# Patient Record
Sex: Female | Born: 1950
Health system: Southern US, Community
[De-identification: ages and names within clinical notes are randomized; demographics above are authoritative.]

## PROBLEM LIST (undated history)

## (undated) DIAGNOSIS — E785 Hyperlipidemia, unspecified: Secondary | ICD-10-CM

## (undated) DIAGNOSIS — I10 Essential (primary) hypertension: Secondary | ICD-10-CM

## (undated) DIAGNOSIS — K8020 Calculus of gallbladder without cholecystitis without obstruction: Secondary | ICD-10-CM

## (undated) HISTORY — PX: KNEE ARTHROSCOPY: SUR90

## (undated) HISTORY — PX: KNEE ARTHROSCOPY: SHX127

## (undated) HISTORY — PX: HERNIA REPAIR: SHX51

## (undated) HISTORY — PX: BACK SURGERY: SHX140

## (undated) HISTORY — DX: Hyperlipidemia, unspecified: E78.5

---

## 1997-08-14 ENCOUNTER — Observation Stay (HOSPITAL_COMMUNITY): Admission: RE | Admit: 1997-08-14 | Discharge: 1997-08-14 | Payer: Self-pay | Admitting: Neurosurgery

## 1997-11-09 ENCOUNTER — Emergency Department (HOSPITAL_COMMUNITY): Admission: EM | Admit: 1997-11-09 | Discharge: 1997-11-09 | Payer: Self-pay | Admitting: Emergency Medicine

## 1997-11-09 ENCOUNTER — Encounter: Payer: Self-pay | Admitting: Emergency Medicine

## 2000-05-26 ENCOUNTER — Ambulatory Visit (HOSPITAL_COMMUNITY): Admission: RE | Admit: 2000-05-26 | Discharge: 2000-05-26 | Payer: Self-pay | Admitting: Neurosurgery

## 2000-05-26 ENCOUNTER — Encounter: Payer: Self-pay | Admitting: Neurosurgery

## 2000-06-09 ENCOUNTER — Encounter: Payer: Self-pay | Admitting: Neurosurgery

## 2000-06-09 ENCOUNTER — Encounter: Admission: RE | Admit: 2000-06-09 | Discharge: 2000-06-09 | Payer: Self-pay | Admitting: Neurosurgery

## 2000-06-30 ENCOUNTER — Encounter: Payer: Self-pay | Admitting: Neurosurgery

## 2000-06-30 ENCOUNTER — Encounter: Admission: RE | Admit: 2000-06-30 | Discharge: 2000-06-30 | Payer: Self-pay | Admitting: Neurosurgery

## 2001-11-06 ENCOUNTER — Ambulatory Visit (HOSPITAL_COMMUNITY): Admission: RE | Admit: 2001-11-06 | Discharge: 2001-11-06 | Payer: Self-pay | Admitting: Family Medicine

## 2001-11-06 ENCOUNTER — Encounter: Payer: Self-pay | Admitting: Family Medicine

## 2001-11-07 ENCOUNTER — Ambulatory Visit (HOSPITAL_COMMUNITY): Admission: RE | Admit: 2001-11-07 | Discharge: 2001-11-07 | Payer: Self-pay | Admitting: Family Medicine

## 2001-11-07 ENCOUNTER — Encounter: Payer: Self-pay | Admitting: Family Medicine

## 2001-11-28 ENCOUNTER — Encounter (HOSPITAL_COMMUNITY): Admission: RE | Admit: 2001-11-28 | Discharge: 2001-12-28 | Payer: Self-pay | Admitting: *Deleted

## 2001-11-28 ENCOUNTER — Encounter: Payer: Self-pay | Admitting: *Deleted

## 2003-08-15 ENCOUNTER — Ambulatory Visit (HOSPITAL_COMMUNITY): Admission: RE | Admit: 2003-08-15 | Discharge: 2003-08-15 | Payer: Self-pay | Admitting: *Deleted

## 2007-10-23 ENCOUNTER — Other Ambulatory Visit: Admission: RE | Admit: 2007-10-23 | Discharge: 2007-10-23 | Payer: Self-pay | Admitting: Obstetrics and Gynecology

## 2007-11-07 ENCOUNTER — Ambulatory Visit (HOSPITAL_COMMUNITY): Admission: RE | Admit: 2007-11-07 | Discharge: 2007-11-07 | Payer: Self-pay | Admitting: Obstetrics and Gynecology

## 2009-03-07 HISTORY — PX: ENDOMETRIAL BIOPSY: SHX622

## 2009-08-19 ENCOUNTER — Other Ambulatory Visit: Admission: RE | Admit: 2009-08-19 | Discharge: 2009-08-19 | Payer: Self-pay | Admitting: Obstetrics and Gynecology

## 2009-09-14 ENCOUNTER — Other Ambulatory Visit: Admission: RE | Admit: 2009-09-14 | Discharge: 2009-09-14 | Payer: Self-pay | Admitting: Obstetrics & Gynecology

## 2010-06-17 ENCOUNTER — Other Ambulatory Visit (HOSPITAL_COMMUNITY): Payer: Self-pay | Admitting: Family Medicine

## 2010-06-17 ENCOUNTER — Ambulatory Visit (HOSPITAL_COMMUNITY)
Admission: RE | Admit: 2010-06-17 | Discharge: 2010-06-17 | Disposition: A | Discharge status: Home / Self Care | Payer: BC Managed Care – PPO | Source: Ambulatory Visit | Attending: Family Medicine | Admitting: Family Medicine

## 2010-06-17 DIAGNOSIS — Z139 Encounter for screening, unspecified: Secondary | ICD-10-CM

## 2010-06-17 DIAGNOSIS — Z1231 Encounter for screening mammogram for malignant neoplasm of breast: Secondary | ICD-10-CM | POA: Insufficient documentation

## 2010-06-17 DIAGNOSIS — R059 Cough, unspecified: Secondary | ICD-10-CM

## 2010-06-17 DIAGNOSIS — R05 Cough: Secondary | ICD-10-CM

## 2010-07-23 NOTE — Procedures (Signed)
   NAME:  Madison Garrison, Madison Garrison                         ACCOUNT NO.:   MEDICAL RECORD NO.:  [id number]                   PATIENT TYPE:   LOCATION:                                       FACILITY:  [facility]   PHYSICIAN:  Cecil Cranker, M.D. [facility]         DATE OF BIRTH:  09-Sep-1950   DATE OF PROCEDURE:  DATE OF DISCHARGE:                                    STRESS TEST   This is a 60 year old patient with history of shortness of breath and  palpitations.  Baseline EKG normal sinus rhythm.  The patient exercised 7  minutes 52 seconds to Bruce protocol obtaining a heart rate of 160.  Target  heart rate was 143.  Test was stopped due to shortness of breath and  fatigue.  She had 0.5 mm upsloping ST depression inferiorly.  No arrhythmias  or chest pain.  Cardiolite images are to follow.     Shanon Payor, M.D. [facility]    ML/MEDQ  D:  11/28/2001  T:  11/29/2001  Job:  [id number]

## 2011-06-22 ENCOUNTER — Other Ambulatory Visit (HOSPITAL_COMMUNITY)
Admission: RE | Admit: 2011-06-22 | Discharge: 2011-06-22 | Disposition: A | Discharge status: Home / Self Care | Payer: BC Managed Care – PPO | Source: Ambulatory Visit | Attending: Obstetrics and Gynecology | Admitting: Obstetrics and Gynecology

## 2011-06-22 ENCOUNTER — Other Ambulatory Visit: Payer: Self-pay | Admitting: Adult Health

## 2011-06-22 DIAGNOSIS — Z01419 Encounter for gynecological examination (general) (routine) without abnormal findings: Secondary | ICD-10-CM | POA: Insufficient documentation

## 2013-06-14 ENCOUNTER — Other Ambulatory Visit: Payer: Self-pay | Admitting: Adult Health

## 2013-06-25 ENCOUNTER — Other Ambulatory Visit (HOSPITAL_COMMUNITY)
Admission: RE | Admit: 2013-06-25 | Discharge: 2013-06-25 | Disposition: A | Discharge status: Home / Self Care | Payer: BC Managed Care – PPO | Source: Ambulatory Visit | Attending: Adult Health | Admitting: Adult Health

## 2013-06-25 ENCOUNTER — Encounter: Payer: Self-pay | Admitting: Adult Health

## 2013-06-25 ENCOUNTER — Ambulatory Visit (INDEPENDENT_AMBULATORY_CARE_PROVIDER_SITE_OTHER): Payer: BC Managed Care – PPO | Admitting: Adult Health

## 2013-06-25 VITALS — BP 120/92 | HR 78 | Ht 64.0 in | Wt 223.0 lb

## 2013-06-25 DIAGNOSIS — Z01419 Encounter for gynecological examination (general) (routine) without abnormal findings: Secondary | ICD-10-CM | POA: Insufficient documentation

## 2013-06-25 DIAGNOSIS — Z1151 Encounter for screening for human papillomavirus (HPV): Secondary | ICD-10-CM | POA: Insufficient documentation

## 2013-06-25 DIAGNOSIS — Z1212 Encounter for screening for malignant neoplasm of rectum: Secondary | ICD-10-CM

## 2013-06-25 LAB — COMPREHENSIVE METABOLIC PANEL
ALK PHOS: 95 U/L (ref 39–117)
ALT: 24 U/L (ref 0–35)
AST: 17 U/L (ref 0–37)
Albumin: 4.2 g/dL (ref 3.5–5.2)
BILIRUBIN TOTAL: 0.6 mg/dL (ref 0.2–1.2)
BUN: 12 mg/dL (ref 6–23)
CO2: 28 mEq/L (ref 19–32)
CREATININE: 0.69 mg/dL (ref 0.50–1.10)
Calcium: 9.2 mg/dL (ref 8.4–10.5)
Chloride: 99 mEq/L (ref 96–112)
GLUCOSE: 102 mg/dL — AB (ref 70–99)
POTASSIUM: 4 meq/L (ref 3.5–5.3)
Sodium: 140 mEq/L (ref 135–145)
Total Protein: 7.1 g/dL (ref 6.0–8.3)

## 2013-06-25 LAB — CBC
HEMATOCRIT: 39.6 % (ref 36.0–46.0)
HEMOGLOBIN: 13.1 g/dL (ref 12.0–15.0)
MCH: 27.9 pg (ref 26.0–34.0)
MCHC: 33.1 g/dL (ref 30.0–36.0)
MCV: 84.3 fL (ref 78.0–100.0)
Platelets: 255 10*3/uL (ref 150–400)
RBC: 4.7 MIL/uL (ref 3.87–5.11)
RDW: 14.6 % (ref 11.5–15.5)
WBC: 6.7 10*3/uL (ref 4.0–10.5)

## 2013-06-25 LAB — LIPID PANEL
CHOLESTEROL: 236 mg/dL — AB (ref 0–200)
HDL: 46 mg/dL (ref >39)
LDL Cholesterol: 155 mg/dL — ABNORMAL HIGH (ref 0–99)
TRIGLYCERIDES: 174 mg/dL — AB (ref <150)
Total CHOL/HDL Ratio: 5.1 Ratio
VLDL: 35 mg/dL (ref 0–40)

## 2013-06-25 LAB — HEMOCCULT GUIAC POC 1CARD (OFFICE): FECAL OCCULT BLD: NEGATIVE

## 2013-06-25 NOTE — Progress Notes (Signed)
Patient ID: Madison Garrison, female   DOB: 16-Sep-1950, 63 y.o.   MRN: [id number] History of Present Illness: Madison Garrison is a 63 year old white female in for a pap and physical.She sees Dr Everette Rank for her PCP.   Current Medications, Allergies, Past Medical History, Past Surgical History, Family History and Social History were reviewed in [employer] record.     Review of Systems: patient denies any headaches, blurred vision, shortness of breath, chest pain, abdominal pain, problems with bowel movements, urination, or intercourse. No joint pain or mood swings, has spot on nose that I told her to see dermatologist about 2-3 years ago and she still needs to see.Has had cough but says 90% better than it was.    Physical Exam:BP 120/92  Pulse 78  Ht 5\' 4"  (1.626 m)  Wt 223 lb (101.152 kg)  BMI 38.26 kg/m2 General:  Well developed, well nourished, no acute distress Skin:  Warm and dry, has brown area left nasal area,?teleangioma  Neck:  Midline trachea, normal thyroid Lungs; Clear to auscultation bilaterally Breast:  No dominant palpable mass, retraction, or nipple discharge Cardiovascular: Regular rate and rhythm Abdomen:  Soft, non tender, no hepatosplenomegaly Pelvic:  External genitalia is normal in appearance.  The vagina is normal in appearance for age.               The cervix is atrophic, pap with HPV performed.Marland Kitchen  Uterus is felt to be normal size, shape, and contour.  No  adnexal masses or tenderness noted. Rectal: Good sphincter tone, no polyps, or small hemorrhoids felt.  Hemoccult negative. Extremities:  No swelling  Noted today but she says feet swell at times Psych:  No mood changes, alert and cooperative, seems happy, says she is going to gym and will try to lose weight   Impression: Yearly gyn exam    Plan: Physical in 1 year, pap in 3 if negative HPV Mammogram advised now and yearly Colonoscopy advised Check CBC,CMP,TSH and lipids  See  dermatologist

## 2013-06-25 NOTE — Patient Instructions (Signed)
Physical in 1 year Mammogram yearly colonoscopy advised See dermatologist about nose

## 2013-06-26 LAB — TSH: TSH: 1.678 u[IU]/mL (ref 0.350–4.500)

## 2013-07-02 ENCOUNTER — Telehealth: Payer: Self-pay | Admitting: Adult Health

## 2013-07-02 NOTE — Telephone Encounter (Signed)
Please review labs and advise.

## 2013-07-02 NOTE — Telephone Encounter (Signed)
Left message to call in am  

## 2013-07-03 ENCOUNTER — Telehealth: Payer: Self-pay | Admitting: Adult Health

## 2013-07-03 NOTE — Telephone Encounter (Signed)
Pt aware of pap and lab results and need to incease activity an cut carbs and recheck labs in 6 months, put in recall, if still has elevated lipids will rx statin then

## 2013-12-03 ENCOUNTER — Other Ambulatory Visit: Payer: Self-pay | Admitting: Orthopaedic Surgery

## 2013-12-03 DIAGNOSIS — M25562 Pain in left knee: Secondary | ICD-10-CM

## 2013-12-12 ENCOUNTER — Ambulatory Visit
Admission: RE | Admit: 2013-12-12 | Discharge: 2013-12-12 | Disposition: A | Discharge status: Home / Self Care | Payer: BC Managed Care – PPO | Source: Ambulatory Visit | Attending: Orthopaedic Surgery | Admitting: Orthopaedic Surgery

## 2013-12-12 DIAGNOSIS — M25562 Pain in left knee: Secondary | ICD-10-CM

## 2014-01-06 ENCOUNTER — Encounter: Payer: Self-pay | Admitting: Adult Health

## 2015-05-08 DIAGNOSIS — R7301 Impaired fasting glucose: Secondary | ICD-10-CM | POA: Diagnosis not present

## 2015-05-08 DIAGNOSIS — E782 Mixed hyperlipidemia: Secondary | ICD-10-CM | POA: Diagnosis not present

## 2015-05-11 DIAGNOSIS — R945 Abnormal results of liver function studies: Secondary | ICD-10-CM | POA: Diagnosis not present

## 2015-05-11 DIAGNOSIS — M545 Low back pain: Secondary | ICD-10-CM | POA: Diagnosis not present

## 2015-05-11 DIAGNOSIS — M25569 Pain in unspecified knee: Secondary | ICD-10-CM | POA: Diagnosis not present

## 2015-05-11 DIAGNOSIS — R7301 Impaired fasting glucose: Secondary | ICD-10-CM | POA: Diagnosis not present

## 2015-05-27 DIAGNOSIS — M25562 Pain in left knee: Secondary | ICD-10-CM | POA: Diagnosis not present

## 2015-06-09 DIAGNOSIS — J301 Allergic rhinitis due to pollen: Secondary | ICD-10-CM | POA: Diagnosis not present

## 2015-07-01 DIAGNOSIS — M25562 Pain in left knee: Secondary | ICD-10-CM | POA: Diagnosis not present

## 2016-04-01 DIAGNOSIS — E782 Mixed hyperlipidemia: Secondary | ICD-10-CM | POA: Diagnosis not present

## 2016-04-01 DIAGNOSIS — R7301 Impaired fasting glucose: Secondary | ICD-10-CM | POA: Diagnosis not present

## 2016-04-05 DIAGNOSIS — E782 Mixed hyperlipidemia: Secondary | ICD-10-CM | POA: Diagnosis not present

## 2016-04-05 DIAGNOSIS — R7301 Impaired fasting glucose: Secondary | ICD-10-CM | POA: Diagnosis not present

## 2016-04-05 DIAGNOSIS — Z0000 Encounter for general adult medical examination without abnormal findings: Secondary | ICD-10-CM | POA: Diagnosis not present

## 2016-04-05 DIAGNOSIS — R945 Abnormal results of liver function studies: Secondary | ICD-10-CM | POA: Diagnosis not present

## 2016-08-20 DIAGNOSIS — J0100 Acute maxillary sinusitis, unspecified: Secondary | ICD-10-CM | POA: Diagnosis not present

## 2017-01-02 ENCOUNTER — Encounter (HOSPITAL_COMMUNITY): Payer: Self-pay | Admitting: *Deleted

## 2017-01-02 DIAGNOSIS — Z6836 Body mass index (BMI) 36.0-36.9, adult: Secondary | ICD-10-CM | POA: Diagnosis not present

## 2017-01-02 DIAGNOSIS — Z79899 Other long term (current) drug therapy: Secondary | ICD-10-CM | POA: Insufficient documentation

## 2017-01-02 DIAGNOSIS — R109 Unspecified abdominal pain: Secondary | ICD-10-CM | POA: Diagnosis present

## 2017-01-02 DIAGNOSIS — Z7722 Contact with and (suspected) exposure to environmental tobacco smoke (acute) (chronic): Secondary | ICD-10-CM | POA: Insufficient documentation

## 2017-01-02 DIAGNOSIS — K8012 Calculus of gallbladder with acute and chronic cholecystitis without obstruction: Secondary | ICD-10-CM | POA: Diagnosis not present

## 2017-01-02 DIAGNOSIS — I10 Essential (primary) hypertension: Secondary | ICD-10-CM | POA: Insufficient documentation

## 2017-01-02 DIAGNOSIS — Z7982 Long term (current) use of aspirin: Secondary | ICD-10-CM | POA: Insufficient documentation

## 2017-01-02 DIAGNOSIS — K819 Cholecystitis, unspecified: Secondary | ICD-10-CM | POA: Diagnosis not present

## 2017-01-02 DIAGNOSIS — K5730 Diverticulosis of large intestine without perforation or abscess without bleeding: Secondary | ICD-10-CM | POA: Diagnosis not present

## 2017-01-02 NOTE — ED Triage Notes (Signed)
Pt complains of right upper quadrant pain that started around 0900.

## 2017-01-03 ENCOUNTER — Observation Stay (HOSPITAL_COMMUNITY): Payer: Medicare HMO

## 2017-01-03 ENCOUNTER — Observation Stay (HOSPITAL_COMMUNITY)
Admission: EM | Admit: 2017-01-03 | Discharge: 2017-01-04 | Disposition: A | Discharge status: Home / Self Care | Payer: Medicare HMO | Attending: General Surgery | Admitting: General Surgery

## 2017-01-03 ENCOUNTER — Encounter (HOSPITAL_COMMUNITY): Payer: Self-pay | Admitting: Internal Medicine

## 2017-01-03 ENCOUNTER — Emergency Department (HOSPITAL_COMMUNITY): Payer: Medicare HMO

## 2017-01-03 DIAGNOSIS — K5730 Diverticulosis of large intestine without perforation or abscess without bleeding: Secondary | ICD-10-CM | POA: Diagnosis not present

## 2017-01-03 DIAGNOSIS — R9389 Abnormal findings on diagnostic imaging of other specified body structures: Secondary | ICD-10-CM

## 2017-01-03 DIAGNOSIS — I10 Essential (primary) hypertension: Secondary | ICD-10-CM | POA: Diagnosis not present

## 2017-01-03 DIAGNOSIS — K8000 Calculus of gallbladder with acute cholecystitis without obstruction: Secondary | ICD-10-CM | POA: Diagnosis not present

## 2017-01-03 DIAGNOSIS — E785 Hyperlipidemia, unspecified: Secondary | ICD-10-CM | POA: Diagnosis present

## 2017-01-03 DIAGNOSIS — K819 Cholecystitis, unspecified: Secondary | ICD-10-CM

## 2017-01-03 DIAGNOSIS — K8020 Calculus of gallbladder without cholecystitis without obstruction: Secondary | ICD-10-CM

## 2017-01-03 DIAGNOSIS — R935 Abnormal findings on diagnostic imaging of other abdominal regions, including retroperitoneum: Secondary | ICD-10-CM | POA: Diagnosis present

## 2017-01-03 DIAGNOSIS — Z79899 Other long term (current) drug therapy: Secondary | ICD-10-CM | POA: Diagnosis not present

## 2017-01-03 DIAGNOSIS — K8050 Calculus of bile duct without cholangitis or cholecystitis without obstruction: Secondary | ICD-10-CM | POA: Diagnosis present

## 2017-01-03 DIAGNOSIS — Z7722 Contact with and (suspected) exposure to environmental tobacco smoke (acute) (chronic): Secondary | ICD-10-CM | POA: Diagnosis not present

## 2017-01-03 DIAGNOSIS — N8500 Endometrial hyperplasia, unspecified: Secondary | ICD-10-CM | POA: Diagnosis not present

## 2017-01-03 DIAGNOSIS — Z6836 Body mass index (BMI) 36.0-36.9, adult: Secondary | ICD-10-CM

## 2017-01-03 HISTORY — DX: Calculus of gallbladder without cholecystitis without obstruction: K80.20

## 2017-01-03 HISTORY — DX: Essential (primary) hypertension: I10

## 2017-01-03 LAB — COMPREHENSIVE METABOLIC PANEL
ALBUMIN: 4.2 g/dL (ref 3.5–5.0)
ALT: 21 U/L (ref 14–54)
AST: 16 U/L (ref 15–41)
Alkaline Phosphatase: 101 U/L (ref 38–126)
Anion gap: 8 (ref 5–15)
BILIRUBIN TOTAL: 0.5 mg/dL (ref 0.3–1.2)
BUN: 14 mg/dL (ref 6–20)
CHLORIDE: 100 mmol/L — AB (ref 101–111)
CO2: 29 mmol/L (ref 22–32)
Calcium: 9.3 mg/dL (ref 8.9–10.3)
Creatinine, Ser: 0.72 mg/dL (ref 0.44–1.00)
GFR calc Af Amer: >60 mL/min (ref >60)
GFR calc non Af Amer: >60 mL/min (ref >60)
GLUCOSE: 155 mg/dL — AB (ref 65–99)
POTASSIUM: 3.8 mmol/L (ref 3.5–5.1)
Sodium: 137 mmol/L (ref 135–145)
TOTAL PROTEIN: 7.7 g/dL (ref 6.5–8.1)

## 2017-01-03 LAB — SURGICAL PCR SCREEN
MRSA, PCR: NEGATIVE
Staphylococcus aureus: NEGATIVE

## 2017-01-03 LAB — URINALYSIS, ROUTINE W REFLEX MICROSCOPIC
BILIRUBIN URINE: NEGATIVE
Glucose, UA: NEGATIVE mg/dL
HGB URINE DIPSTICK: NEGATIVE
KETONES UR: NEGATIVE mg/dL
Leukocytes, UA: NEGATIVE
NITRITE: NEGATIVE
Protein, ur: NEGATIVE mg/dL
Specific Gravity, Urine: 1.012 (ref 1.005–1.030)
pH: 7 (ref 5.0–8.0)

## 2017-01-03 LAB — CBC
HCT: 43.7 % (ref 36.0–46.0)
Hemoglobin: 14.3 g/dL (ref 12.0–15.0)
MCH: 28.9 pg (ref 26.0–34.0)
MCHC: 32.7 g/dL (ref 30.0–36.0)
MCV: 88.3 fL (ref 78.0–100.0)
Platelets: 198 10*3/uL (ref 150–400)
RBC: 4.95 MIL/uL (ref 3.87–5.11)
RDW: 13.9 % (ref 11.5–15.5)
WBC: 7.9 10*3/uL (ref 4.0–10.5)

## 2017-01-03 LAB — LIPASE, BLOOD: LIPASE: 50 U/L (ref 11–51)

## 2017-01-03 MED ORDER — SODIUM CHLORIDE 0.45 % IV SOLN
INTRAVENOUS | Status: DC
  Administered 2017-01-03 – 2017-01-04 (×2): via INTRAVENOUS

## 2017-01-03 MED ORDER — FENTANYL CITRATE (PF) 100 MCG/2ML IJ SOLN
50.0000 ug | INTRAMUSCULAR | Status: DC | PRN
  Filled 2017-01-03: qty 2

## 2017-01-03 MED ORDER — FENTANYL CITRATE (PF) 100 MCG/2ML IJ SOLN
50.0000 ug | Freq: Once | INTRAMUSCULAR | Status: AC
  Administered 2017-01-03: 50 ug via INTRAMUSCULAR
  Filled 2017-01-03: qty 2

## 2017-01-03 MED ORDER — ONDANSETRON HCL 4 MG PO TABS: 4.0000 mg | ORAL_TABLET | Freq: Four times a day (QID) | ORAL | Status: DC | PRN

## 2017-01-03 MED ORDER — ONDANSETRON 4 MG PO TBDP
4.0000 mg | ORAL_TABLET | Freq: Once | ORAL | Status: AC
Start: 2017-01-03 — End: 2017-01-03
  Administered 2017-01-03: 4 mg via ORAL
  Filled 2017-01-03: qty 1

## 2017-01-03 MED ORDER — CHLORHEXIDINE GLUCONATE CLOTH 2 % EX PADS
6.0000 | MEDICATED_PAD | Freq: Once | CUTANEOUS | Status: AC
  Administered 2017-01-03: 6 via TOPICAL

## 2017-01-03 MED ORDER — METOPROLOL SUCCINATE ER 25 MG PO TB24
25.0000 mg | ORAL_TABLET | Freq: Every day | ORAL | Status: DC
  Administered 2017-01-03: 25 mg via ORAL
  Filled 2017-01-03: qty 1

## 2017-01-03 MED ORDER — ONDANSETRON HCL 4 MG/2ML IJ SOLN
4.0000 mg | Freq: Four times a day (QID) | INTRAMUSCULAR | Status: DC | PRN
  Administered 2017-01-04: 4 mg via INTRAVENOUS
  Filled 2017-01-03: qty 2

## 2017-01-03 MED ORDER — CEFTRIAXONE SODIUM 2 G IJ SOLR
2.0000 g | INTRAMUSCULAR | Status: DC
  Administered 2017-01-04: 2 g via INTRAVENOUS
  Filled 2017-01-03 (×2): qty 2

## 2017-01-03 MED ORDER — PANTOPRAZOLE SODIUM 40 MG IV SOLR
40.0000 mg | INTRAVENOUS | Status: DC
  Administered 2017-01-03: 40 mg via INTRAVENOUS
  Filled 2017-01-03: qty 40

## 2017-01-03 MED ORDER — IOPAMIDOL (ISOVUE-300) INJECTION 61%
100.0000 mL | Freq: Once | INTRAVENOUS | Status: AC | PRN
  Administered 2017-01-03: 100 mL via INTRAVENOUS

## 2017-01-03 MED ORDER — DEXTROSE 5 % IV SOLN
2.0000 g | Freq: Once | INTRAVENOUS | Status: AC
  Administered 2017-01-03: 2 g via INTRAVENOUS
  Filled 2017-01-03: qty 2

## 2017-01-03 MED ORDER — SODIUM CHLORIDE 0.9 % IV BOLUS (SEPSIS)
500.0000 mL | Freq: Once | INTRAVENOUS | Status: AC
  Administered 2017-01-03: 500 mL via INTRAVENOUS

## 2017-01-03 NOTE — H&P (Signed)
History and Physical    Madison Garrison ZOX:[id number] DOB: 1951-01-02 DOA: 01/03/2017  PCP: Celene Squibb, MD   Patient coming from: Home.  I have personally briefly reviewed patient's old medical records in [place]  Chief Complaint: Abdominal pain.  HPI: Madison Garrison is a 66 y.o. female with medical history significant of hyperlipidemia, not treated hypertension who is coming to the emergency department with complaints of intense, progressively worse RUQ pain radiates into her back associated with frequent burping after she had oat meal and tea for dinner. She also mentions her stomach feels bloated and she has been having occasional flatulence. She denies fever, chills, nausea, emesis, diarrhea, constipation, melena or hematochezia. Denies dysuria, frequency, hematuria or oliguria. No chest pain, dyspnea, dizziness, diaphoresis, palpitations, PND or orthopnea. She mentions that she occasionally gets lower extremity edema is she stays on her feet for an extended period of time. No polyuria, polydipsia or blurred vision.  ED Course: Initial vital signs temperature 97.64F, pulse 80, respirations 20, blood pressure 175/94 mmHg and O2 sat 98% on room air. She received fentanyl 50 g IM 1, Zofran 4 mg tablet, Rocephin 2 g IV PB and a normal saline bolus of 500 mL.  Her workup shows an unremarkable urinalysis. WBC is 7.9, hemoglobin 14.3 and platelets 198. Her lipase level was 50. Her CMP showed a chloride level of 100 mmol/L and a glucose of 155 mg/dL, all other values were normal.  Imaging 1. Possible noncalcified gallstone in the gallbladder neck with mild gallbladder wall thickening or small amount pericholecystic fluid. Given right upper quadrant pain, recommend right upper quadrant ultrasound for further evaluation. 2. Cystic structure adjacent to or within the uncinate process of the pancreas measuring 2.2 cm greatest dimension. Recommend follow-up imaging in 6 months with  contrast-enhanced MRI air pancreas protocol CT. These may reflect sequela of prior pancreatitis or cystic lesion. 3. Indeterminate 2.4 cm left adrenal nodule. No prior exams are available for comparison. In the absence of malignancy history this is probably benign, consider 12 month follow-up adrenal CT. 4. Endometrial fluid or thickening of 14 mm, abnormal for a postmenopausal patient. Recommend initial characterization with nonemergent pelvic ultrasound. 5. Incidental hepatic steatosis, minimal colonic diverticulosis, and aortic atherosclerosis. Aortic Atherosclerosis (ICD10-I70.0).  Please see images and full radiology report for further detail.  Review of Systems: As per HPI otherwise 10 point review of systems negative.    Past Medical History:  Diagnosis Date  . Cholelithiasis   . Hyperlipidemia   . Hypertension     Past Surgical History:  Procedure Laterality Date  . BACK SURGERY    . ENDOMETRIAL BIOPSY  2011  . HERNIA REPAIR    . KNEE ARTHROSCOPY Right   . KNEE ARTHROSCOPY Left      reports that she is a non-smoker but has been exposed to tobacco smoke. She has never used smokeless tobacco. She reports that she does not drink alcohol or use drugs.  No Known Allergies  Family History  Problem Relation Age of Onset  . Hypertension Mother   . Hypertension Father   . Kidney disease Father   . Heart disease Father   . Cancer Daughter 88       melanoma  . Hypertension Maternal Grandmother   . Hypertension Maternal Grandfather   . Hypertension Paternal Grandmother   . Hypertension Paternal Grandfather   . Cancer Sister 40       ovarian   . Diabetes Sister   .  Hypertension Sister   . Mental illness Brother     Prior to Admission medications   Medication Sig Start Date End Date Taking? Authorizing Provider  Calcium Carbonate-Vitamin D (CALCIUM + D PO) Take 1 tablet by mouth daily.    [provider]  cholecalciferol (VITAMIN D) 1000 UNITS tablet Take 1,000  Units by mouth daily.    [provider]  Multiple Vitamins-Minerals (MULTIVITAMIN WITH MINERALS) tablet Take 1 tablet by mouth daily.    [provider]    Physical Exam: Vitals:   01/02/17 2327  BP: (!) 175/94  Pulse: 80  Resp: 20  Temp: 97.8 F (36.6 C)  TempSrc: Oral  SpO2: 98%  Weight: 97.5 kg (215 lb)  Height: 5\' 4"  (1.626 m)    Constitutional: NAD, calm, comfortable Eyes: PERRL, lids and conjunctivae normal ENMT: Mucous membranes are moist. Posterior pharynx clear of any exudate or lesions. Neck: normal, supple, no masses, no thyromegaly Respiratory: clear to auscultation bilaterally, no wheezing, no crackles. Normal respiratory effort. No accessory muscle use.  Cardiovascular: Regular rate and rhythm, no murmurs / rubs / gallops. No extremity edema. 2+ pedal pulses. No carotid bruits.  Abdomen: Soft, positive RUQ tenderness, no guarding/rebound/masses palpated. No hepatosplenomegaly. Bowel sounds positive.  Musculoskeletal: no clubbing / cyanosis. Good ROM, no contractures. Normal muscle tone.  Skin: no rashes, lesions, ulcers. No induration Neurologic: CN 2-12 grossly intact. Sensation intact, DTR normal. Strength 5/5 in all 4.  Psychiatric: Normal judgment and insight. Alert and oriented x 3. Normal mood.    Labs on Admission: I have personally reviewed following labs and imaging studies  CBC:  Recent Labs Lab 01/02/17 2350  WBC 7.9  HGB 14.3  HCT 43.7  MCV 88.3  PLT 161   Basic Metabolic Panel:  Recent Labs Lab 01/02/17 2350  NA 137  K 3.8  CL 100*  CO2 29  GLUCOSE 155*  BUN 14  CREATININE 0.72  CALCIUM 9.3   GFR: Estimated Creatinine Clearance: 78.4 mL/min (by C-G formula based on SCr of 0.72 mg/dL). Liver Function Tests:  Recent Labs Lab 01/02/17 2350  AST 16  ALT 21  ALKPHOS 101  BILITOT 0.5  PROT 7.7  ALBUMIN 4.2    Recent Labs Lab 01/02/17 2350  LIPASE 50   No results for input(s): AMMONIA in the last 168  hours. Coagulation Profile: No results for input(s): INR, PROTIME in the last 168 hours. Cardiac Enzymes: No results for input(s): CKTOTAL, CKMB, CKMBINDEX, TROPONINI in the last 168 hours. BNP (last 3 results) No results for input(s): PROBNP in the last 8760 hours. HbA1C: No results for input(s): HGBA1C in the last 72 hours. CBG: No results for input(s): GLUCAP in the last 168 hours. Lipid Profile: No results for input(s): CHOL, HDL, LDLCALC, TRIG, CHOLHDL, LDLDIRECT in the last 72 hours. Thyroid Function Tests: No results for input(s): TSH, T4TOTAL, FREET4, T3FREE, THYROIDAB in the last 72 hours. Anemia Panel: No results for input(s): VITAMINB12, FOLATE, FERRITIN, TIBC, IRON, RETICCTPCT in the last 72 hours. Urine analysis:    Component Value Date/Time   COLORURINE STRAW (A) 01/02/2017 2329   APPEARANCEUR CLEAR 01/02/2017 2329   LABSPEC 1.012 01/02/2017 2329   PHURINE 7.0 01/02/2017 2329   GLUCOSEU NEGATIVE 01/02/2017 2329   HGBUR NEGATIVE 01/02/2017 2329   BILIRUBINUR NEGATIVE 01/02/2017 2329   KETONESUR NEGATIVE 01/02/2017 2329   PROTEINUR NEGATIVE 01/02/2017 2329   NITRITE NEGATIVE 01/02/2017 2329   LEUKOCYTESUR NEGATIVE 01/02/2017 2329    Radiological Exams on Admission:  Ct Abdomen Pelvis W Contrast  Result Date: 01/03/2017 CLINICAL DATA:  Right upper quadrant pain. EXAM: CT ABDOMEN AND PELVIS WITH CONTRAST TECHNIQUE: Multidetector CT imaging of the abdomen and pelvis was performed using the standard protocol following bolus administration of intravenous contrast. CONTRAST:  113mL ISOVUE-300 IOPAMIDOL (ISOVUE-300) INJECTION 61% COMPARISON:  None. FINDINGS: Lower chest: Linear atelectasis in the right lower lobe. No pleural fluid or consolidation. Hepatobiliary: Decreased hepatic density consistent with steatosis. Questionable noncalcified gallstone in the gallbladder neck with mild gallbladder wall thickening or pericholecystic fluid. No biliary dilatation. Pancreas: 19 x  13 x 22 mm cystic structure within or adjacent of the uncinate process of the pancreas. No ductal dilatation or inflammation. Spleen: Normal in size without focal abnormality. Adrenals/Urinary Tract: 24 mm left adrenal nodule. The right adrenal gland is normal. No hydronephrosis or perinephric edema. Homogeneous renal enhancement with symmetric excretion on delayed phase imaging. 3.5 cm cyst in the lower anterior right kidney. Urinary bladder is physiologically distended without wall thickening. Stomach/Bowel: Stomach is within normal limits. Appendix appears normal. No evidence of bowel wall thickening, distention, or inflammatory changes. Distal colonic tortuosity with minimal diverticulosis. Vascular/Lymphatic: Mild aortic atherosclerosis without aneurysm. No abdominal or pelvic adenopathy. Reproductive: Endometrial thickening versus fluid measuring 14 mm. Ovaries are quiescent. No adnexal mass. Other: Small fat containing umbilical hernia. No free air or ascites. Musculoskeletal: Near complete disc space loss at L5-S1 with grade 1 anterolisthesis. Set arthropathy in the lower lumbar spine. There are no acute or suspicious osseous abnormalities. IMPRESSION: 1. Possible noncalcified gallstone in the gallbladder neck with mild gallbladder wall thickening or small amount pericholecystic fluid. Given right upper quadrant pain, recommend right upper quadrant ultrasound for further evaluation. 2. Cystic structure adjacent to or within the uncinate process of the pancreas measuring 2.2 cm greatest dimension. Recommend follow-up imaging in 6 months with contrast-enhanced MRI air pancreas protocol CT. These may reflect sequela of prior pancreatitis or cystic lesion. 3. Indeterminate 2.4 cm left adrenal nodule. No prior exams are available for comparison. In the absence of malignancy history this is probably benign, consider 12 month follow-up adrenal CT. 4. Endometrial fluid or thickening of 14 mm, abnormal for a  postmenopausal patient. Recommend initial characterization with nonemergent pelvic ultrasound. 5. Incidental hepatic steatosis, minimal colonic diverticulosis, and aortic atherosclerosis. Aortic Atherosclerosis (ICD10-I70.0). Electronically Signed   By: Jeb Levering M.D.   On: 01/03/2017 03:57    EKG: Independently reviewed.   Assessment/Plan Principal Problem:   Biliary colic   Cholelithiasis Observation unit/MedSurg Keep nothing by mouth. Gentle IV hydration. Analgesics as needed. Antiemetics as needed. Protonix 40 mg IVP every 24 hours. Rocephin 2 g IVPB every 24 hours. Right upper quadrant ultrasound. General surgery evaluation later today.  Active Problems:   Hyperlipidemia Continue lifestyle modifications.    Hypertension The patient has had previous measurements with her systolic BP ranging in the 150s at her doctor's office. Discussed briefly with the patient. Her father had a history of uncontrolled/nontreated hypertension and developed hypertensive renal disease. I will check an EKG and start the patient on low-dose beta blocker. Monitor blood pressure.    Abnormal CT of the abdomen Results were discussed with the patient.  She agrees with pelvic ultrasound today for endometriosis. She is also aware of MRI follow-up for pancreatic cyst in 6 months. Also made aware of CT follow-up for left adrenal nodule.    DVT prophylaxis: SCDs. Code Status: Full code. Family Communication:  Disposition Plan: Observation for further evaluation and general surgery  consultation. Consults called: Routine general surgery consult. Admission status: Observation/MedSurg.   Reubin Milan MD [facility] Pager [phone]  If 7PM-7AM, please contact night-coverage [email or web address] Password [email or web address]  01/03/2017, 5:37 AM

## 2017-01-03 NOTE — Consult Note (Signed)
Reason for Consult: Right upper quadrant abdominal pain, cholelithiasis Referring Physician: Dr. Arrien  Madison Garrison is an 66 y.o. female.  HPI: Patient is a 66-year-old white female who was in her usual state of health when she developed an episode of right upper quadrant abdominal pain and nausea.  This occurred yesterday evening.  She states this is her first episode and she has never had this type of pain.  She also was complaining of bloating.  She denies a history of fatty food intolerance, fever, chills, or jaundice.  She presented to the emergency room and a CT scan of the abdomen revealed probable cholelithiasis with cholecystitis.  She was brought into the hospital and states her abdominal pain and nausea have fully resolved.  She is hungry.  She has 0 out of 10 abdominal pain today.  Past Medical History:  Diagnosis Date  . Cholelithiasis   . Hyperlipidemia   . Hypertension     Past Surgical History:  Procedure Laterality Date  . BACK SURGERY    . ENDOMETRIAL BIOPSY  2011  . HERNIA REPAIR    . KNEE ARTHROSCOPY Right   . KNEE ARTHROSCOPY Left     Family History  Problem Relation Age of Onset  . Hypertension Mother   . Hypertension Father   . Kidney disease Father   . Heart disease Father   . Cancer Daughter 32       melanoma  . Hypertension Maternal Grandmother   . Hypertension Maternal Grandfather   . Hypertension Paternal Grandmother   . Hypertension Paternal Grandfather   . Cancer Sister 40       ovarian   . Diabetes Sister   . Hypertension Sister   . Mental illness Brother     Social History:  reports that she is a non-smoker but has been exposed to tobacco smoke. She has never used smokeless tobacco. She reports that she does not drink alcohol or use drugs.  Allergies: No Known Allergies  Medications:  Scheduled: . metoprolol succinate  25 mg Oral Daily  . pantoprazole (PROTONIX) IV  40 mg Intravenous Q24H    Results for orders placed or performed  during the hospital encounter of 01/03/17 (from the past 48 hour(s))  Urinalysis, Routine w reflex microscopic     Status: Abnormal   Collection Time: 01/02/17 11:29 PM  Result Value Ref Range   Color, Urine STRAW (A) YELLOW   APPearance CLEAR CLEAR   Specific Gravity, Urine 1.012 1.005 - 1.030   pH 7.0 5.0 - 8.0   Glucose, UA NEGATIVE NEGATIVE mg/dL   Hgb urine dipstick NEGATIVE NEGATIVE   Bilirubin Urine NEGATIVE NEGATIVE   Ketones, ur NEGATIVE NEGATIVE mg/dL   Protein, ur NEGATIVE NEGATIVE mg/dL   Nitrite NEGATIVE NEGATIVE   Leukocytes, UA NEGATIVE NEGATIVE  Lipase, blood     Status: None   Collection Time: 01/02/17 11:50 PM  Result Value Ref Range   Lipase 50 11 - 51 U/L  Comprehensive metabolic panel     Status: Abnormal   Collection Time: 01/02/17 11:50 PM  Result Value Ref Range   Sodium 137 135 - 145 mmol/L   Potassium 3.8 3.5 - 5.1 mmol/L   Chloride 100 (L) 101 - 111 mmol/L   CO2 29 22 - 32 mmol/L   Glucose, Bld 155 (H) 65 - 99 mg/dL   BUN 14 6 - 20 mg/dL   Creatinine, Ser 0.72 0.44 - 1.00 mg/dL   Calcium 9.3 8.9 - 10.3 mg/dL     Total Protein 7.7 6.5 - 8.1 g/dL   Albumin 4.2 3.5 - 5.0 g/dL   AST 16 15 - 41 U/L   ALT 21 14 - 54 U/L   Alkaline Phosphatase 101 38 - 126 U/L   Total Bilirubin 0.5 0.3 - 1.2 mg/dL   GFR calc non Af Amer >60 >60 mL/min   GFR calc Af Amer >60 >60 mL/min    Comment: (NOTE) The eGFR has been calculated using the CKD EPI equation. This calculation has not been validated in all clinical situations. eGFR's persistently <60 mL/min signify possible Chronic Kidney Disease.    Anion gap 8 5 - 15  CBC     Status: None   Collection Time: 01/02/17 11:50 PM  Result Value Ref Range   WBC 7.9 4.0 - 10.5 K/uL   RBC 4.95 3.87 - 5.11 MIL/uL   Hemoglobin 14.3 12.0 - 15.0 g/dL   HCT 43.7 36.0 - 46.0 %   MCV 88.3 78.0 - 100.0 fL   MCH 28.9 26.0 - 34.0 pg   MCHC 32.7 30.0 - 36.0 g/dL   RDW 13.9 11.5 - 15.5 %   Platelets 198 150 - 400 K/uL    Us  Pelvis (transabdominal Only)  Result Date: 01/03/2017 CLINICAL DATA:  Thickened endometrium. EXAM: TRANSABDOMINAL ULTRASOUND OF PELVIS TECHNIQUE: Transabdominal ultrasound examination of the pelvis was performed including evaluation of the uterus, ovaries, adnexal regions, and pelvic cul-de-sac. COMPARISON:  CT scan of same day. FINDINGS: Uterus Measurements: 9.0 x 5.8 x 4.8 cm. No fibroids or other mass visualized. Endometrium Thickness: 13 mm which is abnormally thickened for postmenopausal patient. Focal calcification is noted. Right ovary Not visualized. Left ovary Not visualized Other findings:  No abnormal free fluid. IMPRESSION: Ovaries are not visualized. Endometrial thickness is considered abnormal for an asymptomatic post-menopausal female. Endometrial sampling should be considered to exclude carcinoma. Electronically Signed   By: James  Green Jr, M.D.   On: 01/03/2017 09:04   Ct Abdomen Pelvis W Contrast  Result Date: 01/03/2017 CLINICAL DATA:  Right upper quadrant pain. EXAM: CT ABDOMEN AND PELVIS WITH CONTRAST TECHNIQUE: Multidetector CT imaging of the abdomen and pelvis was performed using the standard protocol following bolus administration of intravenous contrast. CONTRAST:  100mL ISOVUE-300 IOPAMIDOL (ISOVUE-300) INJECTION 61% COMPARISON:  None. FINDINGS: Lower chest: Linear atelectasis in the right lower lobe. No pleural fluid or consolidation. Hepatobiliary: Decreased hepatic density consistent with steatosis. Questionable noncalcified gallstone in the gallbladder neck with mild gallbladder wall thickening or pericholecystic fluid. No biliary dilatation. Pancreas: 19 x 13 x 22 mm cystic structure within or adjacent of the uncinate process of the pancreas. No ductal dilatation or inflammation. Spleen: Normal in size without focal abnormality. Adrenals/Urinary Tract: 24 mm left adrenal nodule. The right adrenal gland is normal. No hydronephrosis or perinephric edema. Homogeneous renal  enhancement with symmetric excretion on delayed phase imaging. 3.5 cm cyst in the lower anterior right kidney. Urinary bladder is physiologically distended without wall thickening. Stomach/Bowel: Stomach is within normal limits. Appendix appears normal. No evidence of bowel wall thickening, distention, or inflammatory changes. Distal colonic tortuosity with minimal diverticulosis. Vascular/Lymphatic: Mild aortic atherosclerosis without aneurysm. No abdominal or pelvic adenopathy. Reproductive: Endometrial thickening versus fluid measuring 14 mm. Ovaries are quiescent. No adnexal mass. Other: Small fat containing umbilical hernia. No free air or ascites. Musculoskeletal: Near complete disc space loss at L5-S1 with grade 1 anterolisthesis. Set arthropathy in the lower lumbar spine. There are no acute or suspicious osseous abnormalities. IMPRESSION: 1. Possible   noncalcified gallstone in the gallbladder neck with mild gallbladder wall thickening or small amount pericholecystic fluid. Given right upper quadrant pain, recommend right upper quadrant ultrasound for further evaluation. 2. Cystic structure adjacent to or within the uncinate process of the pancreas measuring 2.2 cm greatest dimension. Recommend follow-up imaging in 6 months with contrast-enhanced MRI air pancreas protocol CT. These may reflect sequela of prior pancreatitis or cystic lesion. 3. Indeterminate 2.4 cm left adrenal nodule. No prior exams are available for comparison. In the absence of malignancy history this is probably benign, consider 12 month follow-up adrenal CT. 4. Endometrial fluid or thickening of 14 mm, abnormal for a postmenopausal patient. Recommend initial characterization with nonemergent pelvic ultrasound. 5. Incidental hepatic steatosis, minimal colonic diverticulosis, and aortic atherosclerosis. Aortic Atherosclerosis (ICD10-I70.0). Electronically Signed   By: Melanie  Ehinger M.D.   On: 01/03/2017 03:57   Us Abdomen Limited  Ruq  Result Date: 01/03/2017 CLINICAL DATA:  Suspected gallstone in the gallbladder neck observed on CT scan earlier today. EXAM: ULTRASOUND ABDOMEN LIMITED RIGHT UPPER QUADRANT COMPARISON:  Abdominal CT scan of today's date FINDINGS: Gallbladder: The gallbladder is adequately distended. There is a 16 mm stone impacted in the gallbladder neck exhibiting distal shadowing. A small amount of sludge is present elsewhere in the gallbladder. There is minimal gallbladder wall thickening to 3.2 mm, but there is pericholecystic fluid. There is no positive sonographic Murphy's sign. Common bile duct: Diameter: 4 mm.  No intraluminal echoes are observed. Liver: The hepatic echotexture is mildly increased diffusely. There is no focal mass nor ductal dilation. Portal vein is patent on color Doppler imaging with normal direction of blood flow towards the liver. IMPRESSION: 1.6 cm gallstone which appears impacted in the gallbladder neck. Small amount of pericholecystic fluid and gallbladder wall thickening. No positive sonographic Murphy's sign. The findings may reflect subacute or chronic cholecystitis. Increased hepatic echotexture most compatible with fatty infiltrative change. Electronically Signed   By: David  Jordan M.D.   On: 01/03/2017 09:11    ROS:  Pertinent items are noted in HPI.  Blood pressure (!) 143/93, pulse 78, temperature 97.8 F (36.6 C), temperature source Oral, resp. rate 18, height 5' 4" (1.626 m), weight 215 lb (97.5 kg), SpO2 98 %. Physical Exam: Pleasant white female in no acute distress Head is normocephalic, atraumatic Eyes without scleral icterus Lungs clear to auscultation with equal breath sounds bilaterally Heart examination reveals regular rate and rhythm without S3, S4, murmurs Abdomen soft, nontender, nondistended.  No hepatosplenomegaly, masses, hernias, or rigidity are noted.  Ultrasound of right upper quadrant images personally reviewed  Assessment/Plan: Impression:  Cholecystitis with cholelithiasis, though symptoms have clinically resolved.  No leukocytosis or liver enzyme test elevation noted.  This appears to be her first episode. Plan: I did discuss with the patient that she has an increased risk of having another attack of biliary colic.  I did offer either observation or laparoscopic cholecystectomy.  Patient would like to think about it as she is leaving in a few weeks for a wedding in [place].  I did tell her that there is a greater than 50% chance that she will have another episode of biliary colic over the next few years.  She understands this.  We will advance her to a heart healthy diet today.  She will make her decision later today.  Inocente Krach 01/03/2017, 9:21 AM      

## 2017-01-03 NOTE — ED Provider Notes (Signed)
[facility] EMERGENCY DEPARTMENT Provider Note   CSN: [id number] Arrival date & time: 01/02/17  2310  Time seen 01:25 AM   History   Chief Complaint Chief Complaint  Patient presents with  . Abdominal Pain    HPI Madison Garrison is a 66 y.o. female.  HPI she reports about 9 PM she gradually started getting right upper quadrant pain that got progressively worse.  She states it does not radiate into her chest or her back.  The pain is constant and touching the area makes it hurt more, walking makes it feel a little bit better.  She tried Tums without relief.  Her daughter reports a lot of burping.  She is not sure if she is distended or bloated.  She denies nausea, vomiting, diarrhea, or constipation.  She states she ate oatmeal and drank green tea prior to this happening.  She states she has never had this before.  She states her paternal grandmother had a cholecystectomy.  Patient states she has had inguinal hernia surgery.  PCP Celene Squibb, MD   Past Medical History:  Diagnosis Date  . Hyperlipidemia     There are no active problems to display for this patient.   Past Surgical History:  Procedure Laterality Date  . BACK SURGERY    . ENDOMETRIAL BIOPSY  2011  . HERNIA REPAIR    . KNEE ARTHROSCOPY Right     OB History    Gravida Para Term Preterm AB Living   3 3       2    SAB TAB Ectopic Multiple Live Births           3       Home Medications    Prior to Admission medications   Medication Sig Start Date End Date Taking? Authorizing Provider  Calcium Carbonate-Vitamin D (CALCIUM + D PO) Take 1 tablet by mouth daily.    [provider]  cholecalciferol (VITAMIN D) 1000 UNITS tablet Take 1,000 Units by mouth daily.    [provider]  Multiple Vitamins-Minerals (MULTIVITAMIN WITH MINERALS) tablet Take 1 tablet by mouth daily.    [provider]    Family History Family History  Problem Relation Age of Onset  . Hypertension Mother     . Hypertension Father   . Kidney disease Father   . Heart disease Father   . Cancer Daughter 71       melanoma  . Hypertension Maternal Grandmother   . Hypertension Maternal Grandfather   . Hypertension Paternal Grandmother   . Hypertension Paternal Grandfather   . Cancer Sister 40       ovarian   . Diabetes Sister   . Hypertension Sister   . Mental illness Brother     Social History Social History  Substance Use Topics  . Smoking status: Passive Smoke Exposure - Never Smoker  . Smokeless tobacco: Never Used  . Alcohol use No     Comment: occ.  retired   Allergies   Patient has no known allergies.   Review of Systems Review of Systems  All other systems reviewed and are negative.    Physical Exam Updated Vital Signs BP (!) 175/94 (BP Location: Right Arm)   Pulse 80   Temp 97.8 F (36.6 C) (Oral)   Resp 20   Ht 5\' 4"  (1.626 m)   Wt 97.5 kg (215 lb)   SpO2 98%   BMI 36.90 kg/m   Vital signs normal except hypertension  Physical Exam  Constitutional: She is oriented to person, place, and time. She appears well-developed and well-nourished.  Non-toxic appearance. She does not appear ill. No distress.  HENT:  Head: Normocephalic and atraumatic.  Right Ear: External ear normal.  Left Ear: External ear normal.  Nose: Nose normal. No mucosal edema or rhinorrhea.  Mouth/Throat: Oropharynx is clear and moist and mucous membranes are normal. No dental abscesses or uvula swelling.  Eyes: Pupils are equal, round, and reactive to light. Conjunctivae and EOM are normal.  Neck: Normal range of motion and full passive range of motion without pain. Neck supple.  Cardiovascular: Normal rate, regular rhythm and normal heart sounds.  Exam reveals no gallop and no friction rub.   No murmur heard. Pulmonary/Chest: Effort normal and breath sounds normal. No respiratory distress. She has no wheezes. She has no rhonchi. She has no rales. She exhibits no tenderness and no  crepitus.  Abdominal: Soft. Normal appearance and bowel sounds are normal. She exhibits no distension. There is no tenderness. There is no rebound and no guarding.    Musculoskeletal: Normal range of motion. She exhibits no edema or tenderness.  Moves all extremities well.   Neurological: She is alert and oriented to person, place, and time. She has normal strength. No cranial nerve deficit.  Skin: Skin is warm, dry and intact. No rash noted. No erythema. No pallor.  Psychiatric: Her speech is normal and behavior is normal. Her mood appears anxious.  Nursing note and vitals reviewed.    ED Treatments / Results  Labs (all labs ordered are listed, but only abnormal results are displayed) Results for orders placed or performed during the hospital encounter of 01/03/17  Lipase, blood  Result Value Ref Range   Lipase 50 11 - 51 U/L  Comprehensive metabolic panel  Result Value Ref Range   Sodium 137 135 - 145 mmol/L   Potassium 3.8 3.5 - 5.1 mmol/L   Chloride 100 (L) 101 - 111 mmol/L   CO2 29 22 - 32 mmol/L   Glucose, Bld 155 (H) 65 - 99 mg/dL   BUN 14 6 - 20 mg/dL   Creatinine, Ser 0.72 0.44 - 1.00 mg/dL   Calcium 9.3 8.9 - 10.3 mg/dL   Total Protein 7.7 6.5 - 8.1 g/dL   Albumin 4.2 3.5 - 5.0 g/dL   AST 16 15 - 41 U/L   ALT 21 14 - 54 U/L   Alkaline Phosphatase 101 38 - 126 U/L   Total Bilirubin 0.5 0.3 - 1.2 mg/dL   GFR calc non Af Amer >60 >60 mL/min   GFR calc Af Amer >60 >60 mL/min   Anion gap 8 5 - 15  CBC  Result Value Ref Range   WBC 7.9 4.0 - 10.5 K/uL   RBC 4.95 3.87 - 5.11 MIL/uL   Hemoglobin 14.3 12.0 - 15.0 g/dL   HCT 43.7 36.0 - 46.0 %   MCV 88.3 78.0 - 100.0 fL   MCH 28.9 26.0 - 34.0 pg   MCHC 32.7 30.0 - 36.0 g/dL   RDW 13.9 11.5 - 15.5 %   Platelets 198 150 - 400 K/uL  Urinalysis, Routine w reflex microscopic  Result Value Ref Range   Color, Urine STRAW (A) YELLOW   APPearance CLEAR CLEAR   Specific Gravity, Urine 1.012 1.005 - 1.030   pH 7.0 5.0 -  8.0   Glucose, UA NEGATIVE NEGATIVE mg/dL   Hgb urine dipstick NEGATIVE NEGATIVE   Bilirubin Urine NEGATIVE NEGATIVE  Ketones, ur NEGATIVE NEGATIVE mg/dL   Protein, ur NEGATIVE NEGATIVE mg/dL   Nitrite NEGATIVE NEGATIVE   Leukocytes, UA NEGATIVE NEGATIVE   Laboratory interpretation all normal      EKG  EKG Interpretation None       Radiology Ct Abdomen Pelvis W Contrast  Result Date: 01/03/2017 CLINICAL DATA:  Right upper quadrant pain. EXAM: CT ABDOMEN AND PELVIS WITH CONTRAST TECHNIQUE: Multidetector CT imaging of the abdomen and pelvis was performed using the standard protocol following bolus administration of intravenous contrast. CONTRAST:  169mL ISOVUE-300 IOPAMIDOL (ISOVUE-300) INJECTION 61% COMPARISON:  None. FINDINGS: Lower chest: Linear atelectasis in the right lower lobe. No pleural fluid or consolidation. Hepatobiliary: Decreased hepatic density consistent with steatosis. Questionable noncalcified gallstone in the gallbladder neck with mild gallbladder wall thickening or pericholecystic fluid. No biliary dilatation. Pancreas: 19 x 13 x 22 mm cystic structure within or adjacent of the uncinate process of the pancreas. No ductal dilatation or inflammation. Spleen: Normal in size without focal abnormality. Adrenals/Urinary Tract: 24 mm left adrenal nodule. The right adrenal gland is normal. No hydronephrosis or perinephric edema. Homogeneous renal enhancement with symmetric excretion on delayed phase imaging. 3.5 cm cyst in the lower anterior right kidney. Urinary bladder is physiologically distended without wall thickening. Stomach/Bowel: Stomach is within normal limits. Appendix appears normal. No evidence of bowel wall thickening, distention, or inflammatory changes. Distal colonic tortuosity with minimal diverticulosis. Vascular/Lymphatic: Mild aortic atherosclerosis without aneurysm. No abdominal or pelvic adenopathy. Reproductive: Endometrial thickening versus fluid measuring  14 mm. Ovaries are quiescent. No adnexal mass. Other: Small fat containing umbilical hernia. No free air or ascites. Musculoskeletal: Near complete disc space loss at L5-S1 with grade 1 anterolisthesis. Set arthropathy in the lower lumbar spine. There are no acute or suspicious osseous abnormalities. IMPRESSION: 1. Possible noncalcified gallstone in the gallbladder neck with mild gallbladder wall thickening or small amount pericholecystic fluid. Given right upper quadrant pain, recommend right upper quadrant ultrasound for further evaluation. 2. Cystic structure adjacent to or within the uncinate process of the pancreas measuring 2.2 cm greatest dimension. Recommend follow-up imaging in 6 months with contrast-enhanced MRI air pancreas protocol CT. These may reflect sequela of prior pancreatitis or cystic lesion. 3. Indeterminate 2.4 cm left adrenal nodule. No prior exams are available for comparison. In the absence of malignancy history this is probably benign, consider 12 month follow-up adrenal CT. 4. Endometrial fluid or thickening of 14 mm, abnormal for a postmenopausal patient. Recommend initial characterization with nonemergent pelvic ultrasound. 5. Incidental hepatic steatosis, minimal colonic diverticulosis, and aortic atherosclerosis. Aortic Atherosclerosis (ICD10-I70.0). Electronically Signed   By: Jeb Levering M.D.   On: 01/03/2017 03:57    Procedures Procedures (including critical care time)  Medications Ordered in ED Medications  cefTRIAXone (ROCEPHIN) 2 g in dextrose 5 % 50 mL IVPB (not administered)  fentaNYL (SUBLIMAZE) injection 50 mcg (50 mcg Intramuscular Given 01/03/17 0158)  ondansetron (ZOFRAN-ODT) disintegrating tablet 4 mg (4 mg Oral Given 01/03/17 0159)  sodium chloride 0.9 % bolus 500 mL (0 mLs Intravenous Stopped 01/03/17 0426)  iopamidol (ISOVUE-300) 61 % injection 100 mL (100 mLs Intravenous Contrast Given 01/03/17 0309)     Initial Impression / Assessment and Plan /  ED Course  I have reviewed the triage vital signs and the nursing notes.  Pertinent labs & imaging results that were available during my care of the patient were reviewed by me and considered in my medical decision making (see chart for details).    We discussed  her test results which were all normal.  We discussed her pain was in the location of her gallbladder, ultrasound would be the preferred test however is not available at night.  A CT scan would show her gallbladder if it was severely inflamed.  Patient initially did not want a CT scan.  She wanted to do IM pain medication and see if it resolved her pain and then returned to get an ultrasound done.  2:40 AM nursing staff informs me patient now wants to proceed with the CT scan.  CT scan was ordered.  04:22 AM Dr Olevia Bowens accepts for admission, he will consult surgery.  Patient informed of her test results and need for admission. She is tearful but agreeable.   Final Clinical Impressions(s) / ED Diagnoses   Final diagnoses:  Cholecystitis    Plan admission  Rolland Porter, MD, Barbette Or, MD 01/03/17 [phone]

## 2017-01-03 NOTE — ED Notes (Signed)
Report to [place], RN [id number]

## 2017-01-03 NOTE — Care Management Obs Status (Signed)
[facility] NOTIFICATION   Patient Details  Name: KRISSA UTKE MRN: [id number] Date of Birth: [birth year]   Medicare Observation Status Notification Given:  Yes    Sherald Barge, RN 01/03/2017, 1:34 PM

## 2017-01-03 NOTE — Progress Notes (Signed)
PROGRESS NOTE    Madison Garrison  JKK:[id number] DOB: [birth year] DOA: 01/03/2017 PCP: Celene Squibb, MD    Brief Narrative:  66 year old female who presented with abdominal pain. Patient does have a significant past medical history for dyslipidemia and hypertension. She complained of severe and progressive right upper quadrant abdominal pain, with radiation to her back, associated with abdominal distention, but no fevers or chills.  On the initial physical examination blood pressure 143/93, heart rate 78, respiration 18, oxygen saturation 98% on room air, moist mucous membranes, lungs clear to auscultation bilaterally, no wheezes rales or rhonchi, heart S1-S2 present and rhythm, no gallops rubs or murmurs, the abdomen had positive tenderness right upper quadrant, no guarding, no rebound, no peritoneal signs, no lower extremity edema.  Sodium 137, potassium 3.8, chloride 100, bicarb 29, glucose 155, BUN 14, creatinine 0.72, white count 7.9, hemoglobin 14.3, hematocrit 43.7, platelets 188.  EKG normal sinus rhythm, normal axis, normal intervals, rate 72 bpm.  CT of the abdomen with noncalcified gallstone in the gallbladder neck with mild gallbladder wall thickening or small amount of pericholecystic fluid.  Cystic structure adjacent to or within the uncinate process of the pancreas 2.2 cm.  2.4 cm left adrenal nodule.  Pelvic ultrasound with endometrial thickness.  Abdominal ultrasound with a 1.6 cm gallstone which appears impacted in the gallbladder neck.  Positive fatty liver infiltration.   Patient was admitted to the hospital working diagnosis of acute biliary colic.   Assessment & Plan:   Principal Problem:   Biliary colic Active Problems:   Hyperlipidemia   Hypertension   Cholelithiasis   Abnormal CT of the abdomen   1. Biliary colic in the setting of gallstones. Patient with improve symptoms, but high risk of recurrence, case discusses with Dr. Arnoldo Morale from surgery. Patient has agreed to  have surgery on this admission, she will placed NPO after midnight. Pain control with fentanyl.   2. HTN. Continue blood pressure control, will continue metoprolol succinate and gentle IV fluids.   3. Dyslipidemia. Continue statin therapy  4. Obesity. Will need outpatient follow up, patient with a BMI of 36.    DVT prophylaxis: enxoparin  Code Status: full Family Communication:  No family at the bedside Disposition Plan: home   Consultants:   Surgery, Dr Arnoldo Morale  Procedures:     Antimicrobials:       Subjective: Patient with improved abdominal pain, no nausea or vomiting, no diarrhea, no nausea or vomiting.   Objective: Vitals:   01/02/17 2327 01/03/17 0637  BP: (!) 175/94 (!) 143/93  Pulse: 80 78  Resp: 20 18  Temp: 97.8 F (36.6 C) 97.8 F (36.6 C)  TempSrc: Oral Oral  SpO2: 98% 98%  Weight: 97.5 kg (215 lb)   Height: 5\' 4"  (1.626 m)     Intake/Output Summary (Last 24 hours) at 01/03/17 0749 Last data filed at 01/03/17 0604  Gross per 24 hour  Intake              550 ml  Output                0 ml  Net              550 ml   Filed Weights   01/02/17 2327  Weight: 97.5 kg (215 lb)    Examination:   General: Not in pain or dyspnea.  Neurology: Awake and alert, non focal  E ENT: no pallor, no icterus, oral mucosa moist Cardiovascular: No JVD.  S1-S2 present, rhythmic, no gallops, rubs, or murmurs. No lower extremity edema. Pulmonary: vesicular breath sounds bilaterally, adequate air movement, no wheezing, rhonchi or rales. Gastrointestinal. Abdomen protuberant, no organomegaly, non tender, no rebound or guarding Skin. No rashes Musculoskeletal: no joint deformities     Data Reviewed: I have personally reviewed following labs and imaging studies  CBC:  Recent Labs Lab 01/02/17 2350  WBC 7.9  HGB 14.3  HCT 43.7  MCV 88.3  PLT 798   Basic Metabolic Panel:  Recent Labs Lab 01/02/17 2350  NA 137  K 3.8  CL 100*  CO2 29  GLUCOSE  155*  BUN 14  CREATININE 0.72  CALCIUM 9.3   GFR: Estimated Creatinine Clearance: 78.4 mL/min (by C-G formula based on SCr of 0.72 mg/dL). Liver Function Tests:  Recent Labs Lab 01/02/17 2350  AST 16  ALT 21  ALKPHOS 101  BILITOT 0.5  PROT 7.7  ALBUMIN 4.2    Recent Labs Lab 01/02/17 2350  LIPASE 50   No results for input(s): AMMONIA in the last 168 hours. Coagulation Profile: No results for input(s): INR, PROTIME in the last 168 hours. Cardiac Enzymes: No results for input(s): CKTOTAL, CKMB, CKMBINDEX, TROPONINI in the last 168 hours. BNP (last 3 results) No results for input(s): PROBNP in the last 8760 hours. HbA1C: No results for input(s): HGBA1C in the last 72 hours. CBG: No results for input(s): GLUCAP in the last 168 hours. Lipid Profile: No results for input(s): CHOL, HDL, LDLCALC, TRIG, CHOLHDL, LDLDIRECT in the last 72 hours. Thyroid Function Tests: No results for input(s): TSH, T4TOTAL, FREET4, T3FREE, THYROIDAB in the last 72 hours. Anemia Panel: No results for input(s): VITAMINB12, FOLATE, FERRITIN, TIBC, IRON, RETICCTPCT in the last 72 hours.    Radiology Studies: I have reviewed all of the imaging during this hospital visit personally     Scheduled Meds: . metoprolol succinate  25 mg Oral Daily  . pantoprazole (PROTONIX) IV  40 mg Intravenous Q24H   Continuous Infusions: . sodium chloride    . [START ON 01/04/2017] cefTRIAXone (ROCEPHIN)  IV       LOS: 0 days        [name], MD [facility] Pager [phone]

## 2017-01-03 NOTE — Progress Notes (Signed)
Patient has elected to proceed with a laparoscopic cholecystectomy.  This was scheduled for tomorrow.  The risks and benefits of the procedure including bleeding, infection, hepatobiliary injury, and the possibility of an open procedure were fully explained to the patient, who gave informed consent.

## 2017-01-04 ENCOUNTER — Encounter (HOSPITAL_COMMUNITY)
Admission: EM | Disposition: A | Discharge status: Home / Self Care | Payer: Self-pay | Source: Home / Self Care | Attending: Emergency Medicine

## 2017-01-04 ENCOUNTER — Observation Stay (HOSPITAL_COMMUNITY): Payer: Medicare HMO | Admitting: Anesthesiology

## 2017-01-04 ENCOUNTER — Encounter (HOSPITAL_COMMUNITY): Payer: Self-pay | Admitting: Anesthesiology

## 2017-01-04 DIAGNOSIS — I10 Essential (primary) hypertension: Secondary | ICD-10-CM | POA: Diagnosis not present

## 2017-01-04 DIAGNOSIS — K8010 Calculus of gallbladder with chronic cholecystitis without obstruction: Secondary | ICD-10-CM | POA: Diagnosis not present

## 2017-01-04 DIAGNOSIS — K8000 Calculus of gallbladder with acute cholecystitis without obstruction: Secondary | ICD-10-CM

## 2017-01-04 DIAGNOSIS — E785 Hyperlipidemia, unspecified: Secondary | ICD-10-CM | POA: Diagnosis not present

## 2017-01-04 DIAGNOSIS — Z79899 Other long term (current) drug therapy: Secondary | ICD-10-CM | POA: Diagnosis not present

## 2017-01-04 DIAGNOSIS — K810 Acute cholecystitis: Secondary | ICD-10-CM | POA: Diagnosis not present

## 2017-01-04 DIAGNOSIS — K8020 Calculus of gallbladder without cholecystitis without obstruction: Secondary | ICD-10-CM | POA: Diagnosis not present

## 2017-01-04 DIAGNOSIS — Z7722 Contact with and (suspected) exposure to environmental tobacco smoke (acute) (chronic): Secondary | ICD-10-CM | POA: Diagnosis not present

## 2017-01-04 HISTORY — PX: CHOLECYSTECTOMY: SHX55

## 2017-01-04 LAB — COMPREHENSIVE METABOLIC PANEL
ALBUMIN: 3.9 g/dL (ref 3.5–5.0)
ALK PHOS: 84 U/L (ref 38–126)
ALT: 17 U/L (ref 14–54)
AST: 15 U/L (ref 15–41)
Anion gap: 8 (ref 5–15)
BILIRUBIN TOTAL: 0.8 mg/dL (ref 0.3–1.2)
BUN: 14 mg/dL (ref 6–20)
CALCIUM: 9.1 mg/dL (ref 8.9–10.3)
CO2: 27 mmol/L (ref 22–32)
CREATININE: 0.68 mg/dL (ref 0.44–1.00)
Chloride: 105 mmol/L (ref 101–111)
GFR calc Af Amer: >60 mL/min (ref >60)
GFR calc non Af Amer: >60 mL/min (ref >60)
GLUCOSE: 115 mg/dL — AB (ref 65–99)
Potassium: 3.8 mmol/L (ref 3.5–5.1)
SODIUM: 140 mmol/L (ref 135–145)
TOTAL PROTEIN: 7.1 g/dL (ref 6.5–8.1)

## 2017-01-04 LAB — CBC WITH DIFFERENTIAL/PLATELET
Basophils Absolute: 0 10*3/uL (ref 0.0–0.1)
Basophils Relative: 1 %
EOS ABS: 0.1 10*3/uL (ref 0.0–0.7)
EOS PCT: 1 %
HEMATOCRIT: 42 % (ref 36.0–46.0)
HEMOGLOBIN: 13.9 g/dL (ref 12.0–15.0)
LYMPHS ABS: 2.9 10*3/uL (ref 0.7–4.0)
Lymphocytes Relative: 48 %
MCH: 28.8 pg (ref 26.0–34.0)
MCHC: 33.1 g/dL (ref 30.0–36.0)
MCV: 87 fL (ref 78.0–100.0)
MONOS PCT: 9 %
Monocytes Absolute: 0.5 10*3/uL (ref 0.1–1.0)
NEUTROS PCT: 41 %
Neutro Abs: 2.5 10*3/uL (ref 1.7–7.7)
Platelets: 186 10*3/uL (ref 150–400)
RBC: 4.83 MIL/uL (ref 3.87–5.11)
RDW: 14 % (ref 11.5–15.5)
WBC: 6.1 10*3/uL (ref 4.0–10.5)

## 2017-01-04 LAB — APTT: aPTT: 37 seconds — ABNORMAL HIGH (ref 24–36)

## 2017-01-04 SURGERY — LAPAROSCOPIC CHOLECYSTECTOMY
Anesthesia: General | Site: Abdomen

## 2017-01-04 MED ORDER — FENTANYL CITRATE (PF) 100 MCG/2ML IJ SOLN
25.0000 ug | INTRAMUSCULAR | Status: DC | PRN
  Administered 2017-01-04 (×2): 50 ug via INTRAVENOUS

## 2017-01-04 MED ORDER — SUCCINYLCHOLINE 20MG/ML (10ML) SYRINGE FOR MEDFUSION PUMP - OPTIME
INTRAMUSCULAR | Status: DC | PRN
  Administered 2017-01-04: 120 mg via INTRAVENOUS

## 2017-01-04 MED ORDER — SODIUM CHLORIDE 0.9 % IJ SOLN
INTRAMUSCULAR | Status: AC
  Filled 2017-01-04: qty 10

## 2017-01-04 MED ORDER — DIPHENHYDRAMINE HCL 50 MG/ML IJ SOLN: 12.5000 mg | Freq: Four times a day (QID) | INTRAMUSCULAR | Status: DC | PRN

## 2017-01-04 MED ORDER — DEXAMETHASONE SODIUM PHOSPHATE 4 MG/ML IJ SOLN
4.0000 mg | Freq: Once | INTRAMUSCULAR | Status: AC
  Administered 2017-01-04: 4 mg via INTRAVENOUS

## 2017-01-04 MED ORDER — FENTANYL CITRATE (PF) 100 MCG/2ML IJ SOLN
INTRAMUSCULAR | Status: DC | PRN
  Administered 2017-01-04 (×4): 50 ug via INTRAVENOUS

## 2017-01-04 MED ORDER — SUCCINYLCHOLINE CHLORIDE 20 MG/ML IJ SOLN
INTRAMUSCULAR | Status: AC
  Filled 2017-01-04: qty 1

## 2017-01-04 MED ORDER — EPHEDRINE SULFATE 50 MG/ML IJ SOLN
INTRAMUSCULAR | Status: DC | PRN
  Administered 2017-01-04 (×2): 10 mg via INTRAVENOUS

## 2017-01-04 MED ORDER — ONDANSETRON HCL 4 MG/2ML IJ SOLN
INTRAMUSCULAR | Status: AC
  Filled 2017-01-04: qty 2

## 2017-01-04 MED ORDER — NEOSTIGMINE METHYLSULFATE 10 MG/10ML IV SOLN
INTRAVENOUS | Status: DC | PRN
  Administered 2017-01-04: 3 mg via INTRAVENOUS

## 2017-01-04 MED ORDER — ENOXAPARIN SODIUM 40 MG/0.4ML ~~LOC~~ SOLN: 40.0000 mg | SUBCUTANEOUS | Status: DC

## 2017-01-04 MED ORDER — HEMOSTATIC AGENTS (NO CHARGE) OPTIME
TOPICAL | Status: DC | PRN
  Administered 2017-01-04: 1 via TOPICAL

## 2017-01-04 MED ORDER — KETOROLAC TROMETHAMINE 30 MG/ML IJ SOLN
30.0000 mg | Freq: Once | INTRAMUSCULAR | Status: AC
  Administered 2017-01-04: 30 mg via INTRAVENOUS
  Filled 2017-01-04: qty 1

## 2017-01-04 MED ORDER — POVIDONE-IODINE 10 % EX OINT
TOPICAL_OINTMENT | CUTANEOUS | Status: AC
  Filled 2017-01-04: qty 1

## 2017-01-04 MED ORDER — LACTATED RINGERS IV SOLN
INTRAVENOUS | Status: DC
  Administered 2017-01-04: 1000 mL via INTRAVENOUS
  Administered 2017-01-04: 10:00:00 via INTRAVENOUS

## 2017-01-04 MED ORDER — ONDANSETRON HCL 4 MG/2ML IJ SOLN
4.0000 mg | Freq: Once | INTRAMUSCULAR | Status: AC
  Administered 2017-01-04: 4 mg via INTRAVENOUS

## 2017-01-04 MED ORDER — MIDAZOLAM HCL 2 MG/2ML IJ SOLN
INTRAMUSCULAR | Status: AC
  Filled 2017-01-04: qty 2

## 2017-01-04 MED ORDER — LIDOCAINE HCL (CARDIAC) 10 MG/ML IV SOLN
INTRAVENOUS | Status: DC | PRN
  Administered 2017-01-04: 40 mg via INTRAVENOUS

## 2017-01-04 MED ORDER — ACETAMINOPHEN 325 MG PO TABS: 650.0000 mg | ORAL_TABLET | Freq: Four times a day (QID) | ORAL | Status: DC | PRN

## 2017-01-04 MED ORDER — BUPIVACAINE HCL (PF) 0.5 % IJ SOLN
INTRAMUSCULAR | Status: DC | PRN
Start: 2017-01-04 — End: 2017-01-04
  Administered 2017-01-04: 10 mL

## 2017-01-04 MED ORDER — PROPOFOL 10 MG/ML IV BOLUS
INTRAVENOUS | Status: AC
  Filled 2017-01-04: qty 40

## 2017-01-04 MED ORDER — ACETAMINOPHEN 650 MG RE SUPP: 650.0000 mg | Freq: Four times a day (QID) | RECTAL | Status: DC | PRN

## 2017-01-04 MED ORDER — FENTANYL CITRATE (PF) 250 MCG/5ML IJ SOLN
INTRAMUSCULAR | Status: AC
  Filled 2017-01-04: qty 5

## 2017-01-04 MED ORDER — GLYCOPYRROLATE 0.2 MG/ML IJ SOLN
INTRAMUSCULAR | Status: AC
  Filled 2017-01-04: qty 4

## 2017-01-04 MED ORDER — SIMETHICONE 80 MG PO CHEW: 40.0000 mg | CHEWABLE_TABLET | Freq: Four times a day (QID) | ORAL | Status: DC | PRN

## 2017-01-04 MED ORDER — GLYCOPYRROLATE 0.2 MG/ML IJ SOLN
INTRAMUSCULAR | Status: DC | PRN
  Administered 2017-01-04: 0.2 mg via INTRAVENOUS
  Administered 2017-01-04: .6 mg via INTRAVENOUS

## 2017-01-04 MED ORDER — PROPOFOL 10 MG/ML IV BOLUS
INTRAVENOUS | Status: DC | PRN
  Administered 2017-01-04: 200 mg via INTRAVENOUS

## 2017-01-04 MED ORDER — DIPHENHYDRAMINE HCL 12.5 MG/5ML PO ELIX: 12.5000 mg | ORAL_SOLUTION | Freq: Four times a day (QID) | ORAL | Status: DC | PRN

## 2017-01-04 MED ORDER — OXYCODONE-ACETAMINOPHEN 5-325 MG PO TABS
1.0000 | ORAL_TABLET | ORAL | Status: DC | PRN
  Filled 2017-01-04: qty 1

## 2017-01-04 MED ORDER — ROCURONIUM BROMIDE 50 MG/5ML IV SOLN
INTRAVENOUS | Status: AC
  Filled 2017-01-04: qty 1

## 2017-01-04 MED ORDER — MIDAZOLAM HCL 2 MG/2ML IJ SOLN
1.0000 mg | INTRAMUSCULAR | Status: DC
Start: 2017-01-04 — End: 2017-01-04
  Administered 2017-01-04: 2 mg via INTRAVENOUS

## 2017-01-04 MED ORDER — EPHEDRINE SULFATE 50 MG/ML IJ SOLN
INTRAMUSCULAR | Status: AC
  Filled 2017-01-04: qty 1

## 2017-01-04 MED ORDER — ROCURONIUM 10MG/ML (10ML) SYRINGE FOR MEDFUSION PUMP - OPTIME
INTRAVENOUS | Status: DC | PRN
  Administered 2017-01-04: 22 mg via INTRAVENOUS
  Administered 2017-01-04: 8 mg via INTRAVENOUS

## 2017-01-04 MED ORDER — SODIUM CHLORIDE 0.9 % IR SOLN
Status: DC | PRN
  Administered 2017-01-04: 1000 mL

## 2017-01-04 MED ORDER — BUPIVACAINE HCL (PF) 0.5 % IJ SOLN
INTRAMUSCULAR | Status: AC
  Filled 2017-01-04: qty 30

## 2017-01-04 MED ORDER — DEXAMETHASONE SODIUM PHOSPHATE 4 MG/ML IJ SOLN
INTRAMUSCULAR | Status: AC
  Filled 2017-01-04: qty 1

## 2017-01-04 MED ORDER — POVIDONE-IODINE 10 % OINT PACKET
TOPICAL_OINTMENT | CUTANEOUS | Status: DC | PRN
Start: 2017-01-04 — End: 2017-01-04
  Administered 2017-01-04: 1 via TOPICAL

## 2017-01-04 MED ORDER — OXYCODONE-ACETAMINOPHEN 7.5-325 MG PO TABS: 1.0000 | ORAL_TABLET | ORAL | 0 refills | Status: DC | PRN

## 2017-01-04 MED ORDER — ARTIFICIAL TEARS OPHTHALMIC OINT
TOPICAL_OINTMENT | OPHTHALMIC | Status: AC
  Filled 2017-01-04: qty 3.5

## 2017-01-04 MED ORDER — NEOSTIGMINE METHYLSULFATE 10 MG/10ML IV SOLN
INTRAVENOUS | Status: AC
  Filled 2017-01-04: qty 1

## 2017-01-04 MED ORDER — LIDOCAINE HCL (PF) 1 % IJ SOLN
INTRAMUSCULAR | Status: AC
  Filled 2017-01-04: qty 5

## 2017-01-04 SURGICAL SUPPLY — 46 items
APPLIER CLIP ROT 10 11.4 M/L (STAPLE) ×2
APR CLP MED LRG 11.4X10 (STAPLE) ×1
BAG HAMPER (MISCELLANEOUS) ×2 IMPLANT
BAG RETRIEVAL 10 (BASKET) ×1
CHLORAPREP W/TINT 26ML (MISCELLANEOUS) ×2 IMPLANT
CLIP APPLIE ROT 10 11.4 M/L (STAPLE) ×1 IMPLANT
CLOTH BEACON ORANGE TIMEOUT ST (SAFETY) ×2 IMPLANT
COVER LIGHT HANDLE STERIS (MISCELLANEOUS) ×4 IMPLANT
DECANTER SPIKE VIAL GLASS SM (MISCELLANEOUS) ×2 IMPLANT
ELECT REM PT RETURN 9FT ADLT (ELECTROSURGICAL) ×2
ELECTRODE REM PT RTRN 9FT ADLT (ELECTROSURGICAL) ×1 IMPLANT
FILTER SMOKE EVAC LAPAROSHD (FILTER) ×2 IMPLANT
FORMALIN 10 PREFIL 120ML (MISCELLANEOUS) ×2 IMPLANT
GLOVE BIOGEL PI IND STRL 6.5 (GLOVE) ×2 IMPLANT
GLOVE BIOGEL PI IND STRL 7.0 (GLOVE) ×1 IMPLANT
GLOVE BIOGEL PI INDICATOR 6.5 (GLOVE) ×2
GLOVE BIOGEL PI INDICATOR 7.0 (GLOVE) ×1
GLOVE SURG SS PI 7.5 STRL IVOR (GLOVE) ×2 IMPLANT
GOWN STRL REUS W/ TWL XL LVL3 (GOWN DISPOSABLE) ×1 IMPLANT
GOWN STRL REUS W/TWL LRG LVL3 (GOWN DISPOSABLE) ×4 IMPLANT
GOWN STRL REUS W/TWL XL LVL3 (GOWN DISPOSABLE) ×2
HEMOSTAT SNOW SURGICEL 2X4 (HEMOSTASIS) ×2 IMPLANT
INST SET LAPROSCOPIC AP (KITS) ×2 IMPLANT
IV NS IRRIG 3000ML ARTHROMATIC (IV SOLUTION) IMPLANT
KIT ROOM TURNOVER APOR (KITS) ×2 IMPLANT
MANIFOLD NEPTUNE II (INSTRUMENTS) ×2 IMPLANT
NDL INSUFFLATION 14GA 120MM (NEEDLE) ×1 IMPLANT
NEEDLE INSUFFLATION 14GA 120MM (NEEDLE) ×2 IMPLANT
NS IRRIG 1000ML POUR BTL (IV SOLUTION) ×2 IMPLANT
PACK LAP CHOLE LZT030E (CUSTOM PROCEDURE TRAY) ×2 IMPLANT
PAD ARMBOARD 7.5X6 YLW CONV (MISCELLANEOUS) ×2 IMPLANT
SET BASIN LINEN APH (SET/KITS/TRAYS/PACK) ×2 IMPLANT
SET TUBE IRRIG SUCTION NO TIP (IRRIGATION / IRRIGATOR) IMPLANT
SLEEVE ENDOPATH XCEL 5M (ENDOMECHANICALS) ×2 IMPLANT
SPONGE GAUZE 2X2 8PLY STRL LF (GAUZE/BANDAGES/DRESSINGS) ×5 IMPLANT
STAPLER VISISTAT (STAPLE) ×2 IMPLANT
SUT VICRYL 0 UR6 27IN ABS (SUTURE) ×2 IMPLANT
SYS BAG RETRIEVAL 10MM (BASKET) ×1
SYSTEM BAG RETRIEVAL 10MM (BASKET) ×1 IMPLANT
TAPE PAPER 2X10 WHT MICROPORE (GAUZE/BANDAGES/DRESSINGS) ×1 IMPLANT
TROCAR ENDO BLADELESS 11MM (ENDOMECHANICALS) ×2 IMPLANT
TROCAR XCEL NON-BLD 5MMX100MML (ENDOMECHANICALS) ×2 IMPLANT
TROCAR XCEL UNIV SLVE 11M 100M (ENDOMECHANICALS) ×2 IMPLANT
TUBE CONNECTING 12X1/4 (SUCTIONS) ×2 IMPLANT
TUBING INSUFFLATION (TUBING) ×2 IMPLANT
WARMER LAPAROSCOPE (MISCELLANEOUS) ×2 IMPLANT

## 2017-01-04 NOTE — Interval H&P Note (Signed)
History and Physical Interval Note:  01/04/2017 8:32 AM  Madison Garrison  has presented today for surgery, with the diagnosis of acute cholecystitis, cholelithiasis  The various methods of treatment have been discussed with the patient and family. After consideration of risks, benefits and other options for treatment, the patient has consented to  Procedure(s): LAPAROSCOPIC CHOLECYSTECTOMY (N/A) as a surgical intervention .  The patient's history has been reviewed, patient examined, no change in status, stable for surgery.  I have reviewed the patient's chart and labs.  Questions were answered to the patient's satisfaction.     Aviva Signs

## 2017-01-04 NOTE — Progress Notes (Signed)
Patient has had no further reports of vomiting. Stated would like to only use percocet for pain "sparingly". States "I'll be more comfortable at home." pt aware of percocet prescription for home. Discharge instructions to be reviewed with her when her daughter arrives. Stated she would prefer to rest at this time. Donavan Foil, RN

## 2017-01-04 NOTE — Anesthesia Preprocedure Evaluation (Signed)
Anesthesia Evaluation  Patient identified by MRN, date of birth, ID band Patient awake    Reviewed: Allergy & Precautions, NPO status , Patient's Chart, lab work & pertinent test results  Airway Mallampati: II  TM Distance: >3 FB Neck ROM: Full    Dental  (+) Teeth Intact   Pulmonary neg pulmonary ROS,    breath sounds clear to auscultation       Cardiovascular hypertension, Pt. on medications  Rhythm:Regular Rate:Normal     Neuro/Psych Anxiety negative neurological ROS  negative psych ROS   GI/Hepatic   Endo/Other  negative endocrine ROSMorbid obesity  Renal/GU negative Renal ROS     Musculoskeletal   Abdominal   Peds  Hematology negative hematology ROS (+)   Anesthesia Other Findings   Reproductive/Obstetrics                             Anesthesia Physical Anesthesia Plan  ASA: II  Anesthesia Plan: General   Post-op Pain Management:    Induction: Intravenous, Rapid sequence and Cricoid pressure planned  PONV Risk Score and Plan:   Airway Management Planned: Oral ETT  Additional Equipment:   Intra-op Plan:   Post-operative Plan: Extubation in OR  Informed Consent: I have reviewed the patients History and Physical, chart, labs and discussed the procedure including the risks, benefits and alternatives for the proposed anesthesia with the patient or authorized representative who has indicated his/her understanding and acceptance.     Plan Discussed with:   Anesthesia Plan Comments:         Anesthesia Quick Evaluation

## 2017-01-04 NOTE — Op Note (Signed)
Patient:  Madison Garrison  DOB:  1950-09-09  MRN:  [id number]   Preop Diagnosis: Cholecystitis, cholelithiasis  Postop Diagnosis: Same  Procedure: Laparoscopic cholecystectomy  Surgeon: Aviva Signs, MD  Assistant: Blake Divine, MD  Anes: General endotracheal  Indications: Patient is a 66 year old white female who [facility] with worsening right upper quadrant abdominal pain.  She has acute cholecystitis secondary to cholelithiasis.  The risks and benefits of the procedure including bleeding, infection, hepatobiliary injury, and the possibility of an open procedure were fully explained to the patient, who gave informed consent.  Procedure note: The patient was placed in the supine position.  After induction of general tracheal anesthesia, the abdomen was prepped and draped using the usual sterile technique with DuraPrep.  Surgical site confirmation was performed.  A supraumbilical incision was made down to the fascia.  A Veress needle was introduced into the abdominal cavity and confirmation of placement was done using the saline drop test.  The abdomen was then insufflated to 16 mmHg pressure.  An 11 mm trocar was introduced into the abdominal cavity under direct visualization without difficulty.  Patient was placed in reverse Trendelenburg position and an additional 11 mm trocar was placed in the region and 5 mm trochars were placed in lateral quadrant and right flank regions.  The liver was inspected and noted to be within normal limits.  The gallbladder was retracted in a dynamic fashion in order to provide a critical view of the triangle of Calot.  The cystic artery was first identified.  Endoclips were placed proximally and distally and the cystic artery, and the cystic artery was divided.  The cystic duct was fully identified.  Its juncture to the infundibulum was fully identified.  Endoclips were placed proximally and distally on the cystic duct, and the cystic  duct was divided.  The gallbladder was freed away from the gallbladder fossa using Bovie electrocautery.  The gallbladder was delivered through the epigastric trocar site using an Endo Catch bag.  The gallbladder fossa was inspected and no abnormal bleeding or bile leakage was noted.  Surgicel was placed in the gallbladder fossa.  All fluid and air were then evacuated from the abdominal cavity prior to the removal of the trochars.  All wounds were irrigated with normal saline.  All wounds were injected with 0.5% Sensorcaine.  The supraumbilical fascia was reapproximated using 0 Vicryl interrupted suture.  All skin incisions were closed using staples.  Betadine ointment and dry sterile dressings were applied.  All tape needle counts were correct at the end of the procedure.  Patient was extubated in the operating room and transferred to PACU in stable condition.  Complications: None  EBL: Minimal  Specimen: Gallbladder

## 2017-01-04 NOTE — Addendum Note (Signed)
Addendum  created 01/04/17 1143 by Ollen Bowl, CRNA   Anesthesia Event edited

## 2017-01-04 NOTE — Discharge Summary (Signed)
Physician Discharge Summary  Patient ID: MIKERIA VALIN MRN: [id number] DOB/AGE: November 25, 1950 66 y.o.  Admit date: 01/03/2017 Discharge date: 01/04/2017  Admission Diagnoses: Cholecystitis, cholelithiasis  Discharge Diagnoses: Same Principal Problem:   Biliary colic Active Problems:   Hyperlipidemia   Hypertension   Cholelithiasis   Abnormal CT of the abdomen   Discharged Condition: good  Hospital Course: Patient is a 66 year old white female who was admitted to the hospital after having an episode of right upper quadrant abdominal pain.  She was found to have acute cholecystitis secondary to cholelithiasis.  She underwent laparoscopic cholecystectomy on 01/04/2017.  She tolerated the procedure well.  Her postoperative course has been unremarkable.  She is being discharged home on 01/04/2017 in good and improving condition.  Treatments: surgery: Laparoscopic cholecystectomy on 01/04/2017  Discharge Exam: Blood pressure (!) 169/72, pulse 77, temperature (!) 97.5 F (36.4 C), temperature source Oral, resp. rate 18, height 5\' 4"  (1.626 m), weight 215 lb (97.5 kg), SpO2 92 %. General appearance: alert, cooperative and no distress Resp: clear to auscultation bilaterally Cardio: regular rate and rhythm, S1, S2 normal, no murmur, click, rub or gallop GI: Soft, incisions healing well.  Disposition: Home  Discharge Instructions    Diet - low sodium heart healthy    Complete by:  As directed    Increase activity slowly    Complete by:  As directed      Allergies as of 01/04/2017   No Known Allergies     Medication List    TAKE these medications   aspirin 81 MG chewable tablet Chew 81 mg by mouth daily as needed for mild pain or moderate pain.   CALCIUM + D PO Take 1 tablet by mouth daily.   cholecalciferol 1000 units tablet Commonly known as:  VITAMIN D Take 1,000 Units by mouth daily.   hydrocortisone cream 1 % Apply 1 application topically 2 (two) times daily as  needed for itching.   multivitamin with minerals tablet Take 1 tablet by mouth daily.   oxyCODONE-acetaminophen 7.5-325 MG tablet Commonly known as:  PERCOCET Take 1 tablet by mouth every 4 (four) hours as needed for severe pain.   VITAMIN C PO Take 1 tablet by mouth daily.      Follow-up Information    Aviva Signs, MD. Schedule an appointment as soon as possible for a visit on 01/12/2017.   Specialty:  General Surgery Contact information: [id number] [place] [address] [phone]           Signed: Aviva Signs 01/04/2017, 12:15 PM

## 2017-01-04 NOTE — Transfer of Care (Signed)
Immediate Anesthesia Transfer of Care Note  Patient: Madison Garrison  Procedure(s) Performed: LAPAROSCOPIC CHOLECYSTECTOMY (N/A Abdomen)  Patient Location: PACU  Anesthesia Type:General  Level of Consciousness: awake  Airway & Oxygen Therapy: Patient Spontanous Breathing and Patient connected to face mask oxygen  Post-op Assessment: Report given to RN  Post vital signs: Reviewed and stable  Last Vitals:  Vitals:   01/04/17 0835 01/04/17 0840  BP: (!) 140/92 (!) 150/84  Pulse:    Resp: (!) 23 (!) 38  Temp:    SpO2: 99% 98%    Last Pain:  Vitals:   01/04/17 0720  TempSrc:   PainSc: 2          Complications: No apparent anesthesia complications

## 2017-01-04 NOTE — Care Management Note (Signed)
Case Management Note  Patient Details  Name: Madison Garrison MRN: [id number] Date of Birth: Sep 17, 1950  Subjective/Objective:                 S/p lap chole. Pt seen to deliver MOON. Pt ind, has PCP, transportation and insurance with drug coverage. Pt communicates no needs or concerns about her transition home.    Action/Plan: DC home today. No CM needs noted.   Expected Discharge Date:  01/04/17               Expected Discharge Plan:  Home/Self Care  In-House Referral:  NA  Discharge planning Services  CM Consult  Post Acute Care Choice:  NA Choice offered to:  NA  Status of Service:  Completed, signed off  Sherald Barge, RN 01/04/2017, 12:24 PM

## 2017-01-04 NOTE — Anesthesia Procedure Notes (Signed)
Procedure Name: Intubation Date/Time: 01/04/2017 8:53 AM Performed by: Tressie Stalker E Pre-anesthesia Checklist: Patient identified, Patient being monitored, Timeout performed, Emergency Drugs available and Suction available Patient Re-evaluated:Patient Re-evaluated prior to induction Oxygen Delivery Method: Circle system utilized Preoxygenation: Pre-oxygenation with 100% oxygen Induction Type: IV induction Ventilation: Mask ventilation without difficulty Laryngoscope Size: Mac and 3 Grade View: Grade II Tube type: Oral Tube size: 7.0 mm Number of attempts: 1 Airway Equipment and Method: Stylet Placement Confirmation: ETT inserted through vocal cords under direct vision,  positive ETCO2 and breath sounds checked- equal and bilateral Secured at: 21 cm Tube secured with: Tape Dental Injury: Teeth and Oropharynx as per pre-operative assessment

## 2017-01-04 NOTE — Progress Notes (Signed)
Patient c/o nausea and vomited in bathroom this afternoon. Nursing did not see amount to measure. zofran given as ordered PRN for nausea. States nausea decreased but still "feels queasy". Reports surgical pain to abdomen rated 5/10 on 0-10 pain scale. Offered percocet tablet as ordered PRN for pain. Stated she prefers no pain medication at this time. Discussed notifying nursing staff of need for pain medication before pain gets too much. States understanding. Pt aware of discharge orders for today. States her daughter will pick her up between 8-9 pm. Donavan Foil, RN

## 2017-01-04 NOTE — Anesthesia Postprocedure Evaluation (Signed)
Anesthesia Post Note  Patient: Madison Garrison  Procedure(s) Performed: LAPAROSCOPIC CHOLECYSTECTOMY (N/A Abdomen)  Patient location during evaluation: PACU Anesthesia Type: General Level of consciousness: awake and alert and oriented Pain management: pain level controlled Vital Signs Assessment: post-procedure vital signs reviewed and stable Respiratory status: spontaneous breathing Cardiovascular status: blood pressure returned to baseline and stable Postop Assessment: no apparent nausea or vomiting Anesthetic complications: no     Last Vitals:  Vitals:   01/04/17 1015 01/04/17 1030  BP: (!) 145/84 139/78  Pulse: 72 73  Resp: 19 10  Temp:    SpO2: 100% 95%    Last Pain:  Vitals:   01/04/17 1030  TempSrc:   PainSc: Asleep                 Birgit Nowling

## 2017-01-04 NOTE — Discharge Instructions (Signed)
Laparoscopic Cholecystectomy, Care After °This sheet gives you information about how to care for yourself after your procedure. Your health care provider may also give you more specific instructions. If you have problems or questions, contact your health care provider. °What can I expect after the procedure? °After the procedure, it is common to have: °· Pain at your incision sites. You will be given medicines to control this pain. °· Mild nausea or vomiting. °· Bloating and possible shoulder pain from the air-like gas that was used during the procedure. °Follow these instructions at home: °Incision care  ° °· Follow instructions from your health care provider about how to take care of your incisions. Make sure you: °¨ Wash your hands with soap and water before you change your bandage (dressing). If soap and water are not available, use hand sanitizer. °¨ Change your dressing as told by your health care provider. °¨ Leave stitches (sutures), skin glue, or adhesive strips in place. These skin closures may need to be in place for 2 weeks or longer. If adhesive strip edges start to loosen and curl up, you may trim the loose edges. Do not remove adhesive strips completely unless your health care provider tells you to do that. °· Do not take baths, swim, or use a hot tub until your health care provider approves. Ask your health care provider if you can take showers. You may only be allowed to take sponge baths for bathing. °· Check your incision area every day for signs of infection. Check for: °¨ More redness, swelling, or pain. °¨ More fluid or blood. °¨ Warmth. °¨ Pus or a bad smell. °Activity  °· Do not drive or use heavy machinery while taking prescription pain medicine. °· Do not lift anything that is heavier than 10 lb (4.5 kg) until your health care provider approves. °· Do not play contact sports until your health care provider approves. °· Do not drive for 24 hours if you were given a medicine to help you relax  (sedative). °· Rest as needed. Do not return to work or school until your health care provider approves. °General instructions  °· Take over-the-counter and prescription medicines only as told by your health care provider. °· To prevent or treat constipation while you are taking prescription pain medicine, your health care provider may recommend that you: °¨ Drink enough fluid to keep your urine clear or pale yellow. °¨ Take over-the-counter or prescription medicines. °¨ Eat foods that are high in fiber, such as fresh fruits and vegetables, whole grains, and beans. °¨ Limit foods that are high in fat and processed sugars, such as fried and sweet foods. °Contact a health care provider if: °· You develop a rash. °· You have more redness, swelling, or pain around your incisions. °· You have more fluid or blood coming from your incisions. °· Your incisions feel warm to the touch. °· You have pus or a bad smell coming from your incisions. °· You have a fever. °· One or more of your incisions breaks open. °Get help right away if: °· You have trouble breathing. °· You have chest pain. °· You have increasing pain in your shoulders. °· You faint or feel dizzy when you stand. °· You have severe pain in your abdomen. °· You have nausea or vomiting that lasts for more than one day. °· You have leg pain. °This information is not intended to replace advice given to you by your health care provider. Make sure you discuss any   questions you have with your health care provider. °Document Released: 02/21/2005 Document Revised: 09/12/2015 Document Reviewed: 08/10/2015 °[employer] Interactive Patient Education © 2017 [employer] ° °

## 2017-01-04 NOTE — H&P (View-Only) (Signed)
Reason for Consult: Right upper quadrant abdominal pain, cholelithiasis Referring Physician: Dr. Arrien  Madison Garrison is an 66 y.o. female.  HPI: Patient is a 66-year-old white female who was in her usual state of health when she developed an episode of right upper quadrant abdominal pain and nausea.  This occurred yesterday evening.  She states this is her first episode and she has never had this type of pain.  She also was complaining of bloating.  She denies a history of fatty food intolerance, fever, chills, or jaundice.  She presented to the emergency room and a CT scan of the abdomen revealed probable cholelithiasis with cholecystitis.  She was brought into the hospital and states her abdominal pain and nausea have fully resolved.  She is hungry.  She has 0 out of 10 abdominal pain today.  Past Medical History:  Diagnosis Date  . Cholelithiasis   . Hyperlipidemia   . Hypertension     Past Surgical History:  Procedure Laterality Date  . BACK SURGERY    . ENDOMETRIAL BIOPSY  2011  . HERNIA REPAIR    . KNEE ARTHROSCOPY Right   . KNEE ARTHROSCOPY Left     Family History  Problem Relation Age of Onset  . Hypertension Mother   . Hypertension Father   . Kidney disease Father   . Heart disease Father   . Cancer Daughter 32       melanoma  . Hypertension Maternal Grandmother   . Hypertension Maternal Grandfather   . Hypertension Paternal Grandmother   . Hypertension Paternal Grandfather   . Cancer Sister 40       ovarian   . Diabetes Sister   . Hypertension Sister   . Mental illness Brother     Social History:  reports that she is a non-smoker but has been exposed to tobacco smoke. She has never used smokeless tobacco. She reports that she does not drink alcohol or use drugs.  Allergies: No Known Allergies  Medications:  Scheduled: . metoprolol succinate  25 mg Oral Daily  . pantoprazole (PROTONIX) IV  40 mg Intravenous Q24H    Results for orders placed or performed  during the hospital encounter of 01/03/17 (from the past 48 hour(s))  Urinalysis, Routine w reflex microscopic     Status: Abnormal   Collection Time: 01/02/17 11:29 PM  Result Value Ref Range   Color, Urine STRAW (A) YELLOW   APPearance CLEAR CLEAR   Specific Gravity, Urine 1.012 1.005 - 1.030   pH 7.0 5.0 - 8.0   Glucose, UA NEGATIVE NEGATIVE mg/dL   Hgb urine dipstick NEGATIVE NEGATIVE   Bilirubin Urine NEGATIVE NEGATIVE   Ketones, ur NEGATIVE NEGATIVE mg/dL   Protein, ur NEGATIVE NEGATIVE mg/dL   Nitrite NEGATIVE NEGATIVE   Leukocytes, UA NEGATIVE NEGATIVE  Lipase, blood     Status: None   Collection Time: 01/02/17 11:50 PM  Result Value Ref Range   Lipase 50 11 - 51 U/L  Comprehensive metabolic panel     Status: Abnormal   Collection Time: 01/02/17 11:50 PM  Result Value Ref Range   Sodium 137 135 - 145 mmol/L   Potassium 3.8 3.5 - 5.1 mmol/L   Chloride 100 (L) 101 - 111 mmol/L   CO2 29 22 - 32 mmol/L   Glucose, Bld 155 (H) 65 - 99 mg/dL   BUN 14 6 - 20 mg/dL   Creatinine, Ser 0.72 0.44 - 1.00 mg/dL   Calcium 9.3 8.9 - 10.3 mg/dL     Total Protein 7.7 6.5 - 8.1 g/dL   Albumin 4.2 3.5 - 5.0 g/dL   AST 16 15 - 41 U/L   ALT 21 14 - 54 U/L   Alkaline Phosphatase 101 38 - 126 U/L   Total Bilirubin 0.5 0.3 - 1.2 mg/dL   GFR calc non Af Amer >60 >60 mL/min   GFR calc Af Amer >60 >60 mL/min    Comment: (NOTE) The eGFR has been calculated using the CKD EPI equation. This calculation has not been validated in all clinical situations. eGFR's persistently <60 mL/min signify possible Chronic Kidney Disease.    Anion gap 8 5 - 15  CBC     Status: None   Collection Time: 01/02/17 11:50 PM  Result Value Ref Range   WBC 7.9 4.0 - 10.5 K/uL   RBC 4.95 3.87 - 5.11 MIL/uL   Hemoglobin 14.3 12.0 - 15.0 g/dL   HCT 43.7 36.0 - 46.0 %   MCV 88.3 78.0 - 100.0 fL   MCH 28.9 26.0 - 34.0 pg   MCHC 32.7 30.0 - 36.0 g/dL   RDW 13.9 11.5 - 15.5 %   Platelets 198 150 - 400 K/uL    Us  Pelvis (transabdominal Only)  Result Date: 01/03/2017 CLINICAL DATA:  Thickened endometrium. EXAM: TRANSABDOMINAL ULTRASOUND OF PELVIS TECHNIQUE: Transabdominal ultrasound examination of the pelvis was performed including evaluation of the uterus, ovaries, adnexal regions, and pelvic cul-de-sac. COMPARISON:  CT scan of same day. FINDINGS: Uterus Measurements: 9.0 x 5.8 x 4.8 cm. No fibroids or other mass visualized. Endometrium Thickness: 13 mm which is abnormally thickened for postmenopausal patient. Focal calcification is noted. Right ovary Not visualized. Left ovary Not visualized Other findings:  No abnormal free fluid. IMPRESSION: Ovaries are not visualized. Endometrial thickness is considered abnormal for an asymptomatic post-menopausal female. Endometrial sampling should be considered to exclude carcinoma. Electronically Signed   By: James  Green Jr, M.D.   On: 01/03/2017 09:04   Ct Abdomen Pelvis W Contrast  Result Date: 01/03/2017 CLINICAL DATA:  Right upper quadrant pain. EXAM: CT ABDOMEN AND PELVIS WITH CONTRAST TECHNIQUE: Multidetector CT imaging of the abdomen and pelvis was performed using the standard protocol following bolus administration of intravenous contrast. CONTRAST:  100mL ISOVUE-300 IOPAMIDOL (ISOVUE-300) INJECTION 61% COMPARISON:  None. FINDINGS: Lower chest: Linear atelectasis in the right lower lobe. No pleural fluid or consolidation. Hepatobiliary: Decreased hepatic density consistent with steatosis. Questionable noncalcified gallstone in the gallbladder neck with mild gallbladder wall thickening or pericholecystic fluid. No biliary dilatation. Pancreas: 19 x 13 x 22 mm cystic structure within or adjacent of the uncinate process of the pancreas. No ductal dilatation or inflammation. Spleen: Normal in size without focal abnormality. Adrenals/Urinary Tract: 24 mm left adrenal nodule. The right adrenal gland is normal. No hydronephrosis or perinephric edema. Homogeneous renal  enhancement with symmetric excretion on delayed phase imaging. 3.5 cm cyst in the lower anterior right kidney. Urinary bladder is physiologically distended without wall thickening. Stomach/Bowel: Stomach is within normal limits. Appendix appears normal. No evidence of bowel wall thickening, distention, or inflammatory changes. Distal colonic tortuosity with minimal diverticulosis. Vascular/Lymphatic: Mild aortic atherosclerosis without aneurysm. No abdominal or pelvic adenopathy. Reproductive: Endometrial thickening versus fluid measuring 14 mm. Ovaries are quiescent. No adnexal mass. Other: Small fat containing umbilical hernia. No free air or ascites. Musculoskeletal: Near complete disc space loss at L5-S1 with grade 1 anterolisthesis. Set arthropathy in the lower lumbar spine. There are no acute or suspicious osseous abnormalities. IMPRESSION: 1. Possible   noncalcified gallstone in the gallbladder neck with mild gallbladder wall thickening or small amount pericholecystic fluid. Given right upper quadrant pain, recommend right upper quadrant ultrasound for further evaluation. 2. Cystic structure adjacent to or within the uncinate process of the pancreas measuring 2.2 cm greatest dimension. Recommend follow-up imaging in 6 months with contrast-enhanced MRI air pancreas protocol CT. These may reflect sequela of prior pancreatitis or cystic lesion. 3. Indeterminate 2.4 cm left adrenal nodule. No prior exams are available for comparison. In the absence of malignancy history this is probably benign, consider 12 month follow-up adrenal CT. 4. Endometrial fluid or thickening of 14 mm, abnormal for a postmenopausal patient. Recommend initial characterization with nonemergent pelvic ultrasound. 5. Incidental hepatic steatosis, minimal colonic diverticulosis, and aortic atherosclerosis. Aortic Atherosclerosis (ICD10-I70.0). Electronically Signed   By: Melanie  Ehinger M.D.   On: 01/03/2017 03:57   Us Abdomen Limited  Ruq  Result Date: 01/03/2017 CLINICAL DATA:  Suspected gallstone in the gallbladder neck observed on CT scan earlier today. EXAM: ULTRASOUND ABDOMEN LIMITED RIGHT UPPER QUADRANT COMPARISON:  Abdominal CT scan of today's date FINDINGS: Gallbladder: The gallbladder is adequately distended. There is a 16 mm stone impacted in the gallbladder neck exhibiting distal shadowing. A small amount of sludge is present elsewhere in the gallbladder. There is minimal gallbladder wall thickening to 3.2 mm, but there is pericholecystic fluid. There is no positive sonographic Murphy's sign. Common bile duct: Diameter: 4 mm.  No intraluminal echoes are observed. Liver: The hepatic echotexture is mildly increased diffusely. There is no focal mass nor ductal dilation. Portal vein is patent on color Doppler imaging with normal direction of blood flow towards the liver. IMPRESSION: 1.6 cm gallstone which appears impacted in the gallbladder neck. Small amount of pericholecystic fluid and gallbladder wall thickening. No positive sonographic Murphy's sign. The findings may reflect subacute or chronic cholecystitis. Increased hepatic echotexture most compatible with fatty infiltrative change. Electronically Signed   By: David  Jordan M.D.   On: 01/03/2017 09:11    ROS:  Pertinent items are noted in HPI.  Blood pressure (!) 143/93, pulse 78, temperature 97.8 F (36.6 C), temperature source Oral, resp. rate 18, height 5' 4" (1.626 m), weight 215 lb (97.5 kg), SpO2 98 %. Physical Exam: Pleasant white female in no acute distress Head is normocephalic, atraumatic Eyes without scleral icterus Lungs clear to auscultation with equal breath sounds bilaterally Heart examination reveals regular rate and rhythm without S3, S4, murmurs Abdomen soft, nontender, nondistended.  No hepatosplenomegaly, masses, hernias, or rigidity are noted.  Ultrasound of right upper quadrant images personally reviewed  Assessment/Plan: Impression:  Cholecystitis with cholelithiasis, though symptoms have clinically resolved.  No leukocytosis or liver enzyme test elevation noted.  This appears to be her first episode. Plan: I did discuss with the patient that she has an increased risk of having another attack of biliary colic.  I did offer either observation or laparoscopic cholecystectomy.  Patient would like to think about it as she is leaving in a few weeks for a wedding in [place].  I did tell her that there is a greater than 50% chance that she will have another episode of biliary colic over the next few years.  She understands this.  We will advance her to a heart healthy diet today.  She will make her decision later today.  Mark Jenkins 01/03/2017, 9:21 AM      

## 2017-01-05 ENCOUNTER — Encounter (HOSPITAL_COMMUNITY): Payer: Self-pay | Admitting: General Surgery

## 2017-01-05 NOTE — Anesthesia Postprocedure Evaluation (Signed)
Anesthesia Post Note  Patient: Madison Garrison  Procedure(s) Performed: LAPAROSCOPIC CHOLECYSTECTOMY (N/A Abdomen)  Patient location during evaluation: Nursing Unit Anesthesia Type: General Level of consciousness: awake and patient cooperative Pain management: pain level controlled Vital Signs Assessment: post-procedure vital signs reviewed and stable Respiratory status: spontaneous breathing, nonlabored ventilation and respiratory function stable Cardiovascular status: blood pressure returned to baseline Postop Assessment: no apparent nausea or vomiting Anesthetic complications: no     Last Vitals:  Vitals:   01/04/17 1350 01/04/17 2000  BP: (!) 158/81 (!) 153/83  Pulse: 100 86  Resp: 16 18  Temp: (!) 36.4 C 36.5 C  SpO2: 94% 92%    Last Pain:  Vitals:   01/04/17 2000  TempSrc: Oral  PainSc:                  Arlee Santosuosso J

## 2017-01-05 NOTE — Plan of Care (Signed)
Problem: Skin Integrity: Goal: Risk for impaired skin integrity will decrease Outcome: Progressing Assessed skin integrity-post-op lap chole today; incisional punctures x4 to mid-abdominal area-pressure dressing C/D/I-no drainage or active bleeding to abdominal dressing prior to discharge.

## 2017-01-05 NOTE — Plan of Care (Signed)
Problem: Pain Managment: Goal: General experience of comfort will improve Outcome: Progressing Assessed pain status and pain control; pt c/o tenderness to mid-abdomen-S/P Lap chole today; for discharge this pm w/ prescription; pt refuses pain  medication prior to discharge.

## 2017-01-05 NOTE — Addendum Note (Signed)
Addendum  created 01/05/17 0723 by Charmaine Downs, CRNA   Sign clinical note

## 2017-01-05 NOTE — Progress Notes (Signed)
Alert & responsive; skin warm & dry to touch; color good; post -op vitals obtained; S/P Lap Chole today; abdominal dressing C/D/I-no drainage; no active bleeding R antecubital IV catheter d/ced-noted bleeding from site-additional pressure dressing applied w/ effective results; client sat at bedside w/ additional monitoring; active bleeding for IV site subsided;additional dressing/gauze given to pt to take home; made aware to apply additional pressure if bleeding resumes-verbalized understanding; additional  follow-up instructions given w/ prescription-no apparent distress prior to leaving floor; pt accompanied to home via wheelchair w/ family members. AW

## 2017-01-12 ENCOUNTER — Ambulatory Visit (INDEPENDENT_AMBULATORY_CARE_PROVIDER_SITE_OTHER): Payer: Medicare HMO | Admitting: General Surgery

## 2017-01-12 ENCOUNTER — Encounter: Payer: Self-pay | Admitting: General Surgery

## 2017-01-12 VITALS — BP 160/91 | HR 84 | Temp 99.3°F | Resp 18 | Ht 64.0 in | Wt 212.0 lb

## 2017-01-12 DIAGNOSIS — Z09 Encounter for follow-up examination after completed treatment for conditions other than malignant neoplasm: Secondary | ICD-10-CM

## 2017-01-12 NOTE — Progress Notes (Signed)
Subjective:     Madison Garrison  Status post laparoscopic cholecystectomy.  Doing well.  Has no complaints. Objective:    BP (!) 160/91   Pulse 84   Temp 99.3 F (37.4 C)   Resp 18   Ht 5\' 4"  (1.626 m)   Wt 212 lb (96.2 kg)   BMI 36.39 kg/m   General:  alert, cooperative and no distress  Abdomen soft, incisions healing well.  Staples removed, Steri-Strips applied. Final pathology consistent with diagnosis.     Assessment:    Doing well postoperatively.    Plan:   Patient aware that ultrasound of pelvis revealed endometrial thickening.  Patient states she is followed by Dr. Glo Herring of gynecology.  She has had an endometrial biopsy in the past.  She will contact their office for follow-up of this finding.  Follow-up here as needed.

## 2017-03-24 DIAGNOSIS — N939 Abnormal uterine and vaginal bleeding, unspecified: Secondary | ICD-10-CM | POA: Diagnosis not present

## 2017-03-24 DIAGNOSIS — Z6835 Body mass index (BMI) 35.0-35.9, adult: Secondary | ICD-10-CM | POA: Diagnosis not present

## 2017-03-24 DIAGNOSIS — Z9049 Acquired absence of other specified parts of digestive tract: Secondary | ICD-10-CM | POA: Diagnosis not present

## 2017-04-21 DIAGNOSIS — Z9049 Acquired absence of other specified parts of digestive tract: Secondary | ICD-10-CM | POA: Diagnosis not present

## 2017-04-21 DIAGNOSIS — E782 Mixed hyperlipidemia: Secondary | ICD-10-CM | POA: Diagnosis not present

## 2017-04-21 DIAGNOSIS — R7301 Impaired fasting glucose: Secondary | ICD-10-CM | POA: Diagnosis not present

## 2017-04-21 DIAGNOSIS — Z6835 Body mass index (BMI) 35.0-35.9, adult: Secondary | ICD-10-CM | POA: Diagnosis not present

## 2017-04-21 DIAGNOSIS — N939 Abnormal uterine and vaginal bleeding, unspecified: Secondary | ICD-10-CM | POA: Diagnosis not present

## 2017-04-25 DIAGNOSIS — R7301 Impaired fasting glucose: Secondary | ICD-10-CM | POA: Diagnosis not present

## 2017-04-25 DIAGNOSIS — E782 Mixed hyperlipidemia: Secondary | ICD-10-CM | POA: Diagnosis not present

## 2017-04-25 DIAGNOSIS — M25562 Pain in left knee: Secondary | ICD-10-CM | POA: Diagnosis not present

## 2017-04-25 DIAGNOSIS — I10 Essential (primary) hypertension: Secondary | ICD-10-CM | POA: Diagnosis not present

## 2017-04-25 DIAGNOSIS — R945 Abnormal results of liver function studies: Secondary | ICD-10-CM | POA: Diagnosis not present

## 2017-04-25 DIAGNOSIS — Z0001 Encounter for general adult medical examination with abnormal findings: Secondary | ICD-10-CM | POA: Diagnosis not present

## 2017-05-15 ENCOUNTER — Encounter: Payer: Self-pay | Admitting: Adult Health

## 2017-05-15 ENCOUNTER — Ambulatory Visit (INDEPENDENT_AMBULATORY_CARE_PROVIDER_SITE_OTHER): Payer: Medicare HMO | Admitting: Adult Health

## 2017-05-15 ENCOUNTER — Other Ambulatory Visit (HOSPITAL_COMMUNITY)
Admission: RE | Admit: 2017-05-15 | Discharge: 2017-05-15 | Disposition: A | Discharge status: Home / Self Care | Payer: Medicare HMO | Source: Ambulatory Visit | Attending: Adult Health | Admitting: Adult Health

## 2017-05-15 VITALS — BP 132/78 | HR 78 | Resp 18 | Ht 64.0 in | Wt 218.0 lb

## 2017-05-15 DIAGNOSIS — Z01411 Encounter for gynecological examination (general) (routine) with abnormal findings: Secondary | ICD-10-CM

## 2017-05-15 DIAGNOSIS — Z01419 Encounter for gynecological examination (general) (routine) without abnormal findings: Secondary | ICD-10-CM | POA: Insufficient documentation

## 2017-05-15 DIAGNOSIS — R9389 Abnormal findings on diagnostic imaging of other specified body structures: Secondary | ICD-10-CM

## 2017-05-15 DIAGNOSIS — Z1212 Encounter for screening for malignant neoplasm of rectum: Secondary | ICD-10-CM

## 2017-05-15 DIAGNOSIS — Z1211 Encounter for screening for malignant neoplasm of colon: Secondary | ICD-10-CM | POA: Diagnosis not present

## 2017-05-15 LAB — HEMOCCULT GUIAC POC 1CARD (OFFICE): FECAL OCCULT BLD: NEGATIVE

## 2017-05-15 MED ORDER — MEDROXYPROGESTERONE ACETATE 10 MG PO TABS: ORAL_TABLET | ORAL | 4 refills | Status: DC

## 2017-05-15 NOTE — Progress Notes (Signed)
Patient ID: Madison Garrison, female   DOB: 09-11-1950, 67 y.o.   MRN: [id number] History of Present Illness: Madison Garrison is a 67 year old white female, PM in for a well woman gyn exam and pap, last one was 2015.She had Gallbladder removed in 2018 and US performed at that time showed thickened endometrium was 13 mm, she is having no bleeding.She had benign endometrial biopsy in 2011 after having some spotting. PCP is Dr Nevada Crane.    Current Medications, Allergies, Past Medical History, Past Surgical History, Family History and Social History were reviewed in [employer] record.     Review of Systems: Patient denies any headaches, hearing loss, fatigue, blurred vision, shortness of breath, chest pain, abdominal pain, problems with bowel movements, urination, or intercourse(not having sex). No mood swings.Has pain in knees at times.    Physical Exam:BP 132/78 (BP Location: Left Arm, Patient Position: Sitting, Cuff Size: Normal)   Pulse 78   Resp 18   Ht 5\' 4"  (1.626 m)   Wt 218 lb (98.9 kg)   BMI 37.42 kg/m  General:  Well developed, well nourished, no acute distress Skin:  Warm and dry Neck:  Midline trachea, normal thyroid, good ROM, no lymphadenopathy,no carotid bruits heard Lungs; Clear to auscultation bilaterally Breast:  No dominant palpable mass, retraction, or nipple discharge Cardiovascular: Regular rate and rhythm Abdomen:  Soft, non tender, no hepatosplenomegaly Pelvic:  External genitalia is normal in appearance, no lesions.  The vagina is normal in appearance. Urethra has no lesions or masses. The cervix is smooth, pap with HPV performed.  Uterus is felt to be normal size, shape, and contour.  No adnexal masses or tenderness noted.Bladder is non tender, no masses felt. Rectal: Good sphincter tone, no polyps, or hemorrhoids felt.  Hemoccult negative. Extremities/musculoskeletal:  No swelling or varicosities noted, no clubbing or cyanosis Psych:  No mood changes,  alert and cooperative,seems happy PHQ 2 score 0. Discussed US findings with Dr Elonda Husky and he recommends cycling with provera every 3 months and recheck [place] in 1 year if no bleeding, she should call with any bleeding. And she agrees to this plan.   Impression: 1. Encounter for gynecological examination with Papanicolaou smear of cervix   2. Screening for colorectal cancer   3. Thickened endometrium       Plan: Pap with HPV sent. Call with any bleeding She will start provera April 1.  Meds ordered this encounter  Medications  . medroxyPROGESTERone (PROVERA) 10 MG tablet    Sig: Take 1 daily for 10 days every 3 months    Dispense:  10 tablet    Refill:  4    Order Specific Question:   Supervising Provider    Answer:   Florian Buff [[id number]]  Physical in 1 year Pap in 3 if normal Get mammogram now, is past due  Labs with PCP  Colonoscopy advised, but I doubt she will get

## 2017-05-15 NOTE — Patient Instructions (Addendum)
Take provera 10 mg 1 daily for 10 days every 3 months Call with any bleeding Get mammogram

## 2017-05-16 LAB — CYTOLOGY - PAP
Adequacy: ABSENT
Diagnosis: NEGATIVE
HPV: NOT DETECTED

## 2017-06-06 ENCOUNTER — Ambulatory Visit: Payer: Medicare HMO | Admitting: Adult Health

## 2017-06-06 ENCOUNTER — Encounter: Payer: Self-pay | Admitting: Adult Health

## 2017-06-06 ENCOUNTER — Other Ambulatory Visit: Payer: Self-pay

## 2017-06-06 VITALS — BP 140/80 | HR 86 | Ht 64.0 in | Wt 215.0 lb

## 2017-06-06 DIAGNOSIS — N950 Postmenopausal bleeding: Secondary | ICD-10-CM | POA: Diagnosis not present

## 2017-06-06 DIAGNOSIS — R9389 Abnormal findings on diagnostic imaging of other specified body structures: Secondary | ICD-10-CM | POA: Diagnosis not present

## 2017-06-06 NOTE — Patient Instructions (Signed)
Endometrial Biopsy  Endometrial biopsy is a procedure in which a tissue sample is taken from inside the uterus. The sample is taken from the endometrium, which is the lining of the uterus. The tissue sample is then checked under a microscope to see if the tissue is normal or abnormal. This procedure helps to determine where you are in your menstrual cycle and how hormone levels are affecting the lining of the uterus. This procedure may also be used to evaluate uterine bleeding or to diagnose endometrial cancer, endometrial tuberculosis, polyps, or other inflammatory conditions.  Tell a health care provider about:   Any allergies you have.   All medicines you are taking, including vitamins, herbs, eye drops, creams, and over-the-counter medicines.   Any problems you or family members have had with anesthetic medicines.   Any blood disorders you have.   Any surgeries you have had.   Any medical conditions you have.   Whether you are pregnant or may be pregnant.  What are the risks?  Generally, this is a safe procedure. However, problems may occur, including:   Bleeding.   Pelvic infection.   Puncture of the wall of the uterus with the biopsy device (rare).    What happens before the procedure?   Keep a record of your menstrual cycles as told by your health care provider. You may need to schedule your procedure for a specific time in your cycle.   You may want to bring a sanitary pad to wear after the procedure.   Ask your health care provider about:  ? Changing or stopping your regular medicines. This is especially important if you are taking diabetes medicines or blood thinners.  ? Taking medicines such as aspirin and ibuprofen. These medicines can thin your blood. Do not take these medicines before your procedure if your health care provider instructs you not to.   Plan to have someone take you home from the hospital or clinic.  What happens during the procedure?   To lower your risk of  infection:  ? Your health care team will wash or sanitize their hands.   You will lie on an exam table with your feet and legs supported as in a pelvic exam.   Your health care provider will insert an instrument (speculum) into your vagina to see your cervix.   Your cervix will be cleansed with an antiseptic solution.   A medicine (local anesthetic) will be used to numb the cervix.   A forceps instrument (tenaculum) will be used to hold your cervix steady for the biopsy.   A thin, rod-like instrument (uterine sound) will be inserted through your cervix to determine the length of your uterus and the location where the biopsy sample will be removed.   A thin, flexible tube (catheter) will be inserted through your cervix and into the uterus. The catheter will be used to collect the biopsy sample from your endometrial tissue.   The catheter and speculum will then be removed, and the tissue sample will be sent to a lab for examination.  What happens after the procedure?   You will rest in a recovery area until you are ready to go home.   You may have mild cramping and a small amount of vaginal bleeding. This is normal.   It is up to you to get the results of your procedure. Ask your health care provider, or the department that is doing the procedure, when your results will be ready.  Summary     Endometrial biopsy is a procedure in which a tissue sample is taken from the endometrium, which is the lining of the uterus.   This procedure may help to diagnose menstrual cycle problems, abnormal bleeding, or other conditions affecting the endometrium.   Before the procedure, keep a record of your menstrual cycles as told by your health care provider.   The tissue sample that is removed will be checked under a microscope to see if it is normal or abnormal.  This information is not intended to replace advice given to you by your health care provider. Make sure you discuss any questions you have with your health care  provider.  Document Released: 06/24/2004 Document Revised: 03/09/2016 Document Reviewed: 03/09/2016  Elsevier Interactive Patient Education  2017 [employer]

## 2017-06-06 NOTE — Progress Notes (Signed)
Subjective:     Patient ID: Madison Garrison, female   DOB: 08/10/1950, 67 y.o.   MRN: [id number]  HPI Tokiko is a 67 year old white female in complaining of bleeding after provera.She had thickened endometrium on [place] of 13 mm 01/03/17.She had benign endometrial biopsy in 2011.She had normal pap 05/15/17.   Review of Systems +bleeding after provera Reviewed past medical,surgical, social and family history. Reviewed medications and allergies.     Objective:   Physical Exam BP 140/80 (BP Location: Right Arm, Patient Position: Sitting, Cuff Size: Normal)   Pulse 86   Ht 5\' 4"  (1.626 m)   Wt 215 lb (97.5 kg)   BMI 36.90 kg/m   Talk only: since had bleeding will get endometrial biopsy. She declines exam, has bleeding has stopped.    Assessment:     1. Thickened endometrium   2. PMB (postmenopausal bleeding)       Plan:     Return in 1 day for endometrial biopsy with Dr Glo Herring Review handout on endometrial biopsy  Can take advil or aleve before biopsy

## 2017-06-07 ENCOUNTER — Encounter: Payer: Self-pay | Admitting: Obstetrics and Gynecology

## 2017-06-07 ENCOUNTER — Ambulatory Visit: Payer: Medicare HMO | Admitting: Obstetrics and Gynecology

## 2017-06-07 ENCOUNTER — Other Ambulatory Visit: Payer: Self-pay | Admitting: Obstetrics and Gynecology

## 2017-06-07 VITALS — BP 130/74 | HR 78 | Ht 64.0 in | Wt 215.0 lb

## 2017-06-07 DIAGNOSIS — N840 Polyp of corpus uteri: Secondary | ICD-10-CM | POA: Diagnosis not present

## 2017-06-07 DIAGNOSIS — N950 Postmenopausal bleeding: Secondary | ICD-10-CM

## 2017-06-07 NOTE — Progress Notes (Signed)
Patient ID: Madison Garrison, female   DOB: [birth year], 67 y.o.   MRN: [id number]  Endometrial Biopsy: Patient given informed consent, signed copy in the chart, time out was performed. Time out taken. The patient was placed in the lithotomy position and the cervix brought into view with sterile speculum.  Portio of cervix cleansed x 2 with betadine swabs.  A tenaculum was placed in the anterior lip of the cervix.cervix stenotic, barely allows the 3 mm biopsy pipette. The uterus was sounded for depth of 8.5 cm,. Milex uterine Explora 3 mm was introduced to into the uterus, suction created,  and an endometrial sample was obtained. All equipment was removed and accounted for.   The patient tolerated the procedure well.   Patient given post procedure instructions.  Followup: PRN, Results by phone or text. Does not do my chart  By signing my name below, I, Margit Banda, attest that this documentation has been prepared under the direction and in the presence of Jonnie Kind, MD. Electronically Signed: Margit Banda, Medical Scribe. 06/07/17. 10:04 AM.  I personally performed the services described in this documentation, which was SCRIBED in my presence. The recorded information has been reviewed and considered accurate. It has been edited as necessary during review. Jonnie Kind, MD

## 2017-06-09 ENCOUNTER — Telehealth: Payer: Self-pay | Admitting: Obstetrics and Gynecology

## 2017-06-09 DIAGNOSIS — N939 Abnormal uterine and vaginal bleeding, unspecified: Secondary | ICD-10-CM | POA: Diagnosis not present

## 2017-06-09 DIAGNOSIS — Z9049 Acquired absence of other specified parts of digestive tract: Secondary | ICD-10-CM | POA: Diagnosis not present

## 2017-06-09 DIAGNOSIS — R945 Abnormal results of liver function studies: Secondary | ICD-10-CM | POA: Diagnosis not present

## 2017-06-09 DIAGNOSIS — M25562 Pain in left knee: Secondary | ICD-10-CM | POA: Diagnosis not present

## 2017-06-09 DIAGNOSIS — R7301 Impaired fasting glucose: Secondary | ICD-10-CM | POA: Diagnosis not present

## 2017-06-09 DIAGNOSIS — Z0001 Encounter for general adult medical examination with abnormal findings: Secondary | ICD-10-CM | POA: Diagnosis not present

## 2017-06-09 DIAGNOSIS — I10 Essential (primary) hypertension: Secondary | ICD-10-CM | POA: Diagnosis not present

## 2017-06-09 DIAGNOSIS — L0390 Cellulitis, unspecified: Secondary | ICD-10-CM | POA: Diagnosis not present

## 2017-06-09 DIAGNOSIS — Z6835 Body mass index (BMI) 35.0-35.9, adult: Secondary | ICD-10-CM | POA: Diagnosis not present

## 2017-06-09 DIAGNOSIS — E782 Mixed hyperlipidemia: Secondary | ICD-10-CM | POA: Diagnosis not present

## 2017-06-09 NOTE — Telephone Encounter (Signed)
Pt advised of benign results, that indicate "endometrioid type polyps" Transvaginal u/s advised to clarify polyps.  Will schedule thru office.

## 2017-06-09 NOTE — Progress Notes (Signed)
Endometrial biopsy benign wth 'ENDOMETRIOID TYPE POLYPS'. The CT showed a 14 mm endometrium. The u/s was transabdominal only. The pt needs an u/s transvaginal , to allow deciding on whether to perform D&C

## 2017-06-14 ENCOUNTER — Telehealth: Payer: Self-pay | Admitting: Obstetrics and Gynecology

## 2017-06-14 NOTE — Telephone Encounter (Signed)
Discussed with patient biopsy findings and Dr Johnnye Sima recommendation for TV ultrasound.  Patient will multiple questions but all questions answered.  Will schedule u/s.

## 2017-06-14 NOTE — Telephone Encounter (Signed)
Patient called stating that she would like a phone call from the nurse she just spoke to regarding her results. Please contact pt

## 2017-06-14 NOTE — Telephone Encounter (Signed)
LMOVM returning patient's call.  

## 2017-07-06 ENCOUNTER — Other Ambulatory Visit: Payer: Self-pay | Admitting: Obstetrics and Gynecology

## 2017-07-06 DIAGNOSIS — R9389 Abnormal findings on diagnostic imaging of other specified body structures: Secondary | ICD-10-CM

## 2017-07-07 ENCOUNTER — Telehealth: Payer: Self-pay | Admitting: Obstetrics and Gynecology

## 2017-07-07 ENCOUNTER — Ambulatory Visit (INDEPENDENT_AMBULATORY_CARE_PROVIDER_SITE_OTHER): Payer: Medicare HMO

## 2017-07-07 DIAGNOSIS — N950 Postmenopausal bleeding: Secondary | ICD-10-CM | POA: Diagnosis not present

## 2017-07-07 DIAGNOSIS — R9389 Abnormal findings on diagnostic imaging of other specified body structures: Secondary | ICD-10-CM | POA: Diagnosis not present

## 2017-07-07 NOTE — Progress Notes (Signed)
PELVIC US TA/TV: homogeneous anteverted uterus,wnl,homogeneous thickened endometrium 15 mm.no color flow,normal ovaries bilat,limited view of right ovary,left ovary appears mobile,no free fluid

## 2017-07-07 NOTE — Telephone Encounter (Signed)
Patient contacted and informed that the endometrium is thickend, but no large polyps with stalk, and biopsy has been done which was benign. Options of treatment by D&C , or by expectant following discussed, and pt is not inclined to proceed to d&c at this time. Will follow prn.

## 2017-09-25 DIAGNOSIS — Z6836 Body mass index (BMI) 36.0-36.9, adult: Secondary | ICD-10-CM | POA: Diagnosis not present

## 2017-09-25 DIAGNOSIS — H6122 Impacted cerumen, left ear: Secondary | ICD-10-CM | POA: Diagnosis not present

## 2017-09-25 DIAGNOSIS — R222 Localized swelling, mass and lump, trunk: Secondary | ICD-10-CM | POA: Diagnosis not present

## 2017-09-25 DIAGNOSIS — L723 Sebaceous cyst: Secondary | ICD-10-CM | POA: Diagnosis not present

## 2017-11-20 DIAGNOSIS — L728 Other follicular cysts of the skin and subcutaneous tissue: Secondary | ICD-10-CM | POA: Diagnosis not present

## 2017-11-20 DIAGNOSIS — L720 Epidermal cyst: Secondary | ICD-10-CM | POA: Diagnosis not present

## 2017-11-20 DIAGNOSIS — L7211 Pilar cyst: Secondary | ICD-10-CM | POA: Diagnosis not present

## 2018-01-11 DIAGNOSIS — R69 Illness, unspecified: Secondary | ICD-10-CM | POA: Diagnosis not present

## 2018-01-18 DIAGNOSIS — R69 Illness, unspecified: Secondary | ICD-10-CM | POA: Diagnosis not present

## 2018-02-08 DIAGNOSIS — M25562 Pain in left knee: Secondary | ICD-10-CM | POA: Diagnosis not present

## 2018-02-08 DIAGNOSIS — H6122 Impacted cerumen, left ear: Secondary | ICD-10-CM | POA: Diagnosis not present

## 2018-02-08 DIAGNOSIS — H9222 Otorrhagia, left ear: Secondary | ICD-10-CM | POA: Diagnosis not present

## 2018-02-26 DIAGNOSIS — R030 Elevated blood-pressure reading, without diagnosis of hypertension: Secondary | ICD-10-CM | POA: Diagnosis not present

## 2018-02-26 DIAGNOSIS — L0390 Cellulitis, unspecified: Secondary | ICD-10-CM | POA: Diagnosis not present

## 2018-03-06 DIAGNOSIS — L0291 Cutaneous abscess, unspecified: Secondary | ICD-10-CM | POA: Diagnosis not present

## 2018-03-12 DIAGNOSIS — A4902 Methicillin resistant Staphylococcus aureus infection, unspecified site: Secondary | ICD-10-CM | POA: Diagnosis not present

## 2018-03-12 DIAGNOSIS — L02229 Furuncle of trunk, unspecified: Secondary | ICD-10-CM | POA: Diagnosis not present

## 2018-03-12 DIAGNOSIS — B9689 Other specified bacterial agents as the cause of diseases classified elsewhere: Secondary | ICD-10-CM | POA: Diagnosis not present

## 2018-04-12 DIAGNOSIS — L720 Epidermal cyst: Secondary | ICD-10-CM | POA: Diagnosis not present

## 2018-06-20 DIAGNOSIS — Z0000 Encounter for general adult medical examination without abnormal findings: Secondary | ICD-10-CM | POA: Diagnosis not present

## 2018-06-21 ENCOUNTER — Other Ambulatory Visit: Payer: Self-pay | Admitting: Internal Medicine

## 2018-06-21 DIAGNOSIS — E2839 Other primary ovarian failure: Secondary | ICD-10-CM

## 2018-06-21 DIAGNOSIS — Z780 Asymptomatic menopausal state: Secondary | ICD-10-CM

## 2018-08-15 DIAGNOSIS — Z6836 Body mass index (BMI) 36.0-36.9, adult: Secondary | ICD-10-CM | POA: Diagnosis not present

## 2018-08-15 DIAGNOSIS — Z9049 Acquired absence of other specified parts of digestive tract: Secondary | ICD-10-CM | POA: Diagnosis not present

## 2018-08-15 DIAGNOSIS — L723 Sebaceous cyst: Secondary | ICD-10-CM | POA: Diagnosis not present

## 2018-08-15 DIAGNOSIS — E782 Mixed hyperlipidemia: Secondary | ICD-10-CM | POA: Diagnosis not present

## 2018-08-15 DIAGNOSIS — Z6835 Body mass index (BMI) 35.0-35.9, adult: Secondary | ICD-10-CM | POA: Diagnosis not present

## 2018-08-15 DIAGNOSIS — H9222 Otorrhagia, left ear: Secondary | ICD-10-CM | POA: Diagnosis not present

## 2018-08-15 DIAGNOSIS — R222 Localized swelling, mass and lump, trunk: Secondary | ICD-10-CM | POA: Diagnosis not present

## 2018-08-15 DIAGNOSIS — H6122 Impacted cerumen, left ear: Secondary | ICD-10-CM | POA: Diagnosis not present

## 2018-08-15 DIAGNOSIS — Z0000 Encounter for general adult medical examination without abnormal findings: Secondary | ICD-10-CM | POA: Diagnosis not present

## 2018-08-15 DIAGNOSIS — N939 Abnormal uterine and vaginal bleeding, unspecified: Secondary | ICD-10-CM | POA: Diagnosis not present

## 2018-08-22 DIAGNOSIS — R945 Abnormal results of liver function studies: Secondary | ICD-10-CM | POA: Diagnosis not present

## 2018-08-22 DIAGNOSIS — I10 Essential (primary) hypertension: Secondary | ICD-10-CM | POA: Diagnosis not present

## 2018-08-22 DIAGNOSIS — R7301 Impaired fasting glucose: Secondary | ICD-10-CM | POA: Diagnosis not present

## 2018-08-22 DIAGNOSIS — Z0001 Encounter for general adult medical examination with abnormal findings: Secondary | ICD-10-CM | POA: Diagnosis not present

## 2018-08-22 DIAGNOSIS — E782 Mixed hyperlipidemia: Secondary | ICD-10-CM | POA: Diagnosis not present

## 2018-12-10 IMAGING — CT CT ABD-PELV W/ CM
2 of 5 series · 15 of 46 positions shown, 17 images · IV contrast (Isovue)
Comparison: None.

CLINICAL DATA: Right upper quadrant pain.

EXAM:
CT ABDOMEN AND PELVIS WITH CONTRAST
TECHNIQUE: Multidetector CT imaging of the abdomen and pelvis was performed
using the standard protocol following bolus administration of
intravenous contrast.
CONTRAST:  100mL [id number] IOPAMIDOL (F73JGY-T00) INJECTION 61%

[Series 2: axial st · axial · 0.93mm/px · z∈[-258,+162]mm · 12 of 96 slices shown, 14 images]
[im 6/96  soft-tissue]
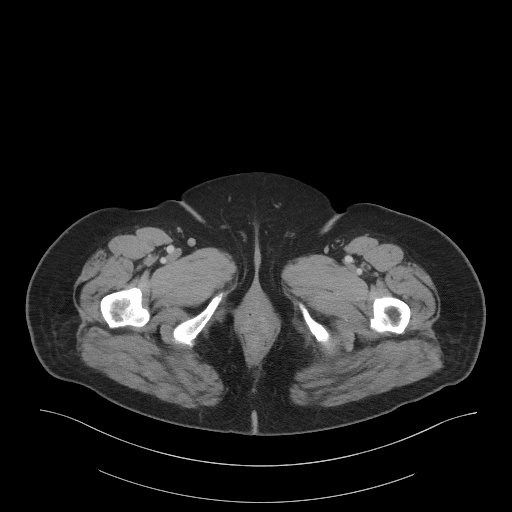
[im 6/96  bone]
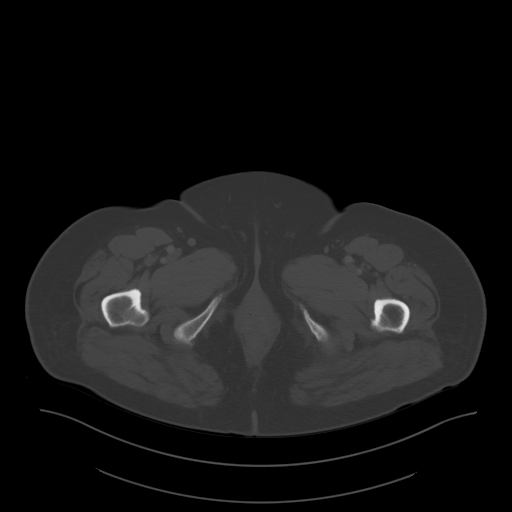
[im 17/96  soft-tissue]
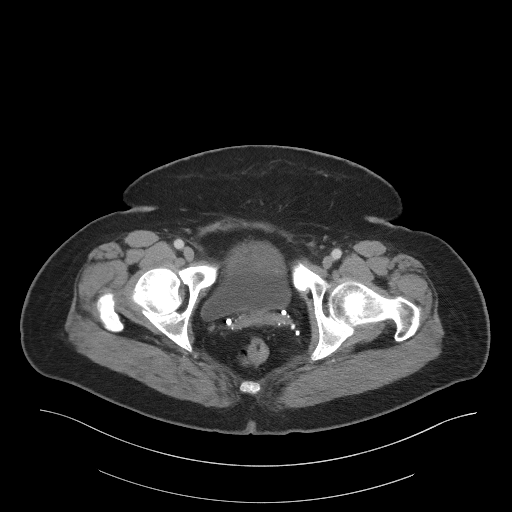
[im 23/96  soft-tissue]
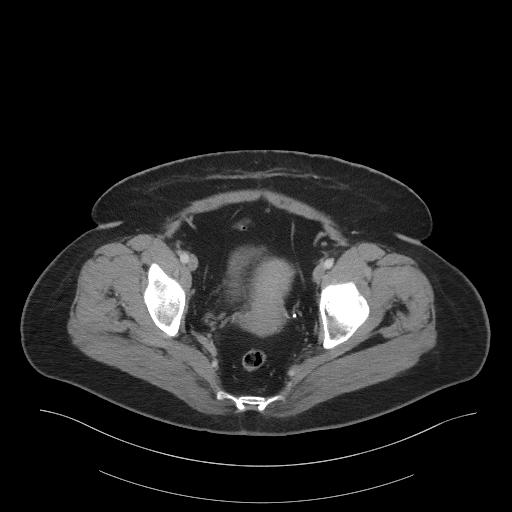
[im 28/96  soft-tissue]
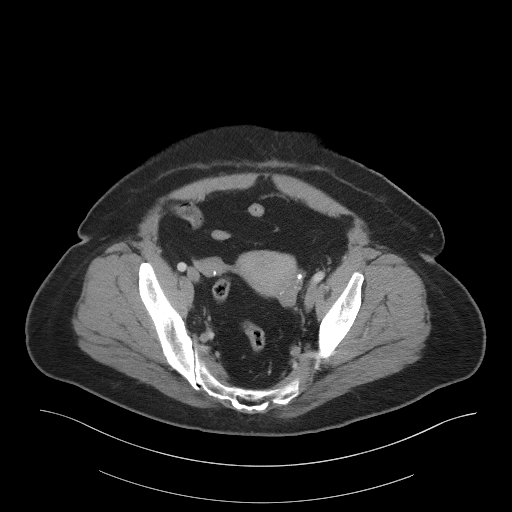
[im 40/96  soft-tissue]
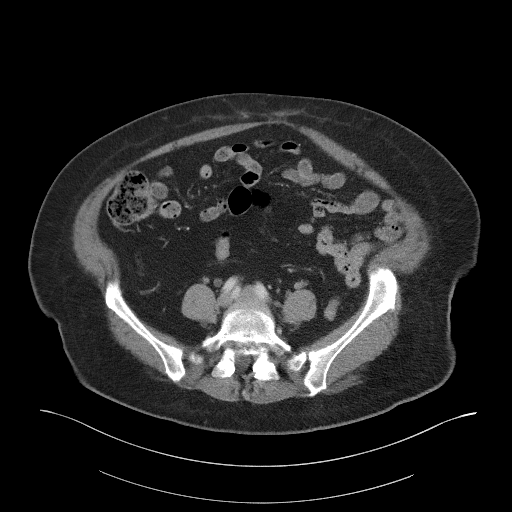
[im 45/96  soft-tissue]
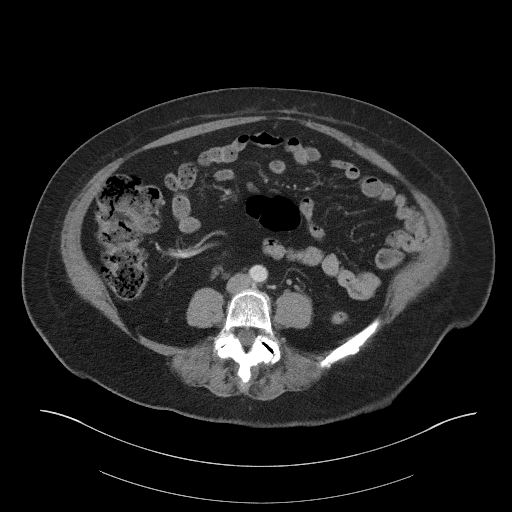
[im 51/96  soft-tissue]
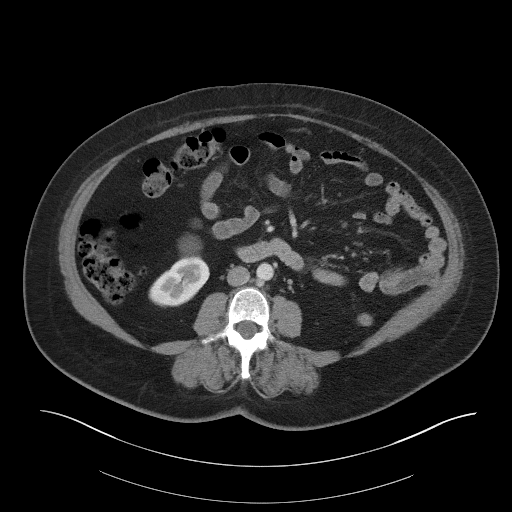
[im 62/96  soft-tissue]
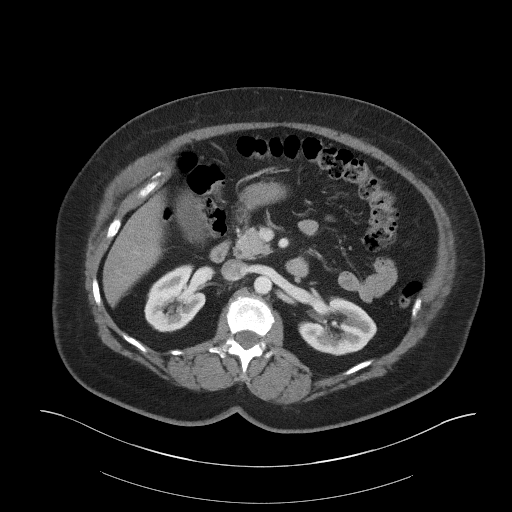
[im 68/96  soft-tissue]
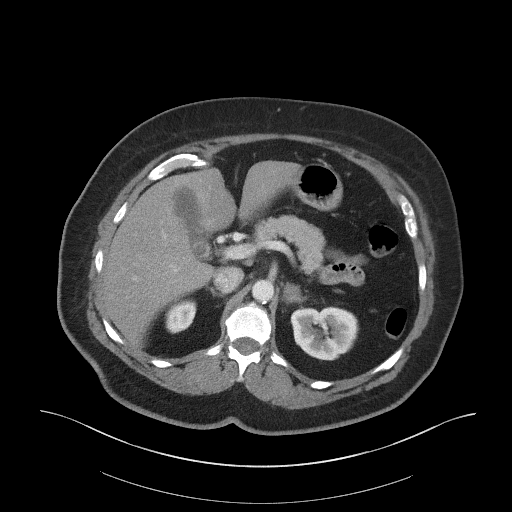
[im 68/96  bone]
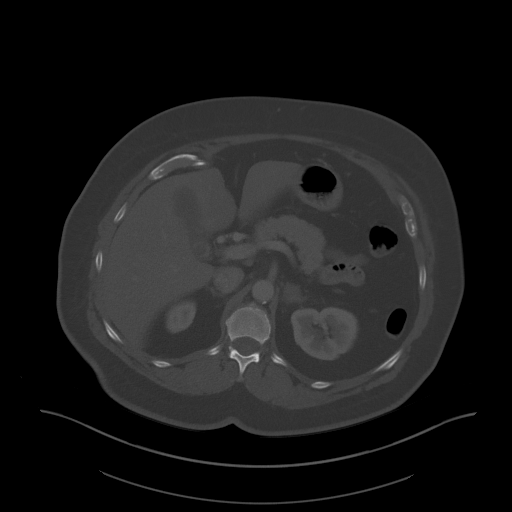
[im 73/96  soft-tissue]
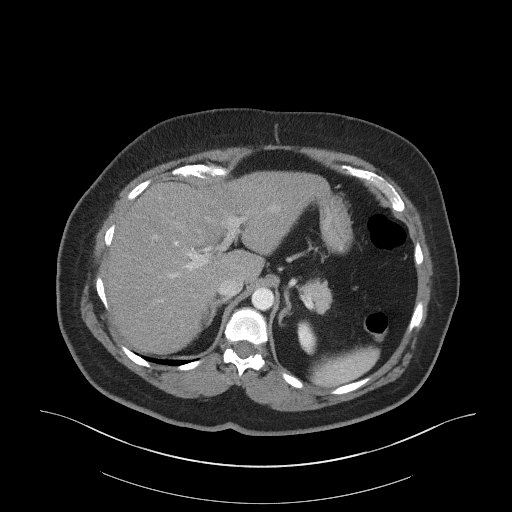
[im 84/96  soft-tissue]
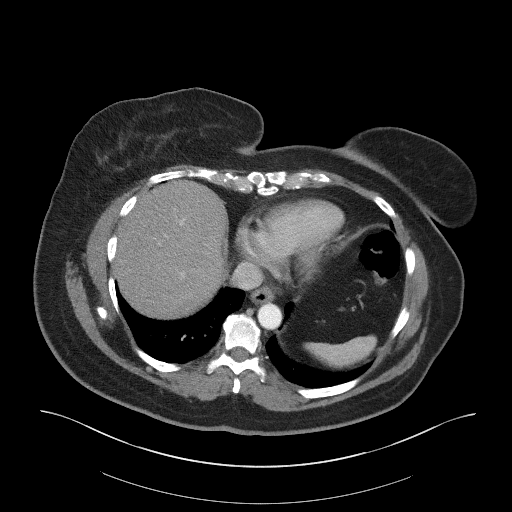
[im 90/96  soft-tissue]
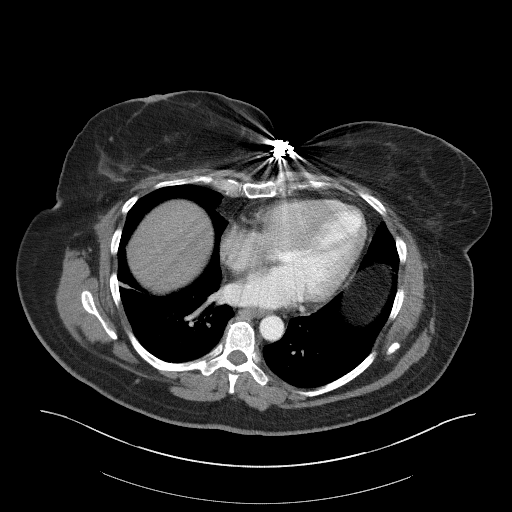

[Series 5: coronal st · coronal · 0.83mm/px · 3 of 116 slices shown]
[im 39/116  soft-tissue]
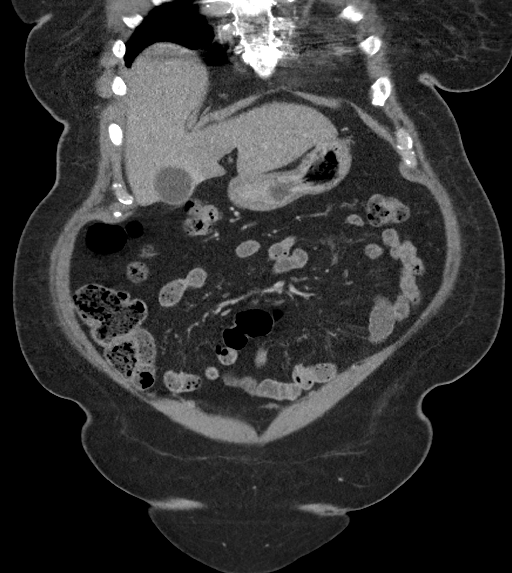
[im 52/116  soft-tissue]
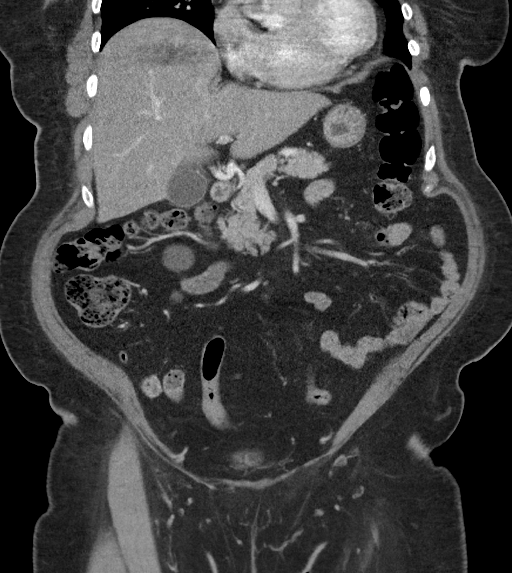
[im 64/116  soft-tissue]
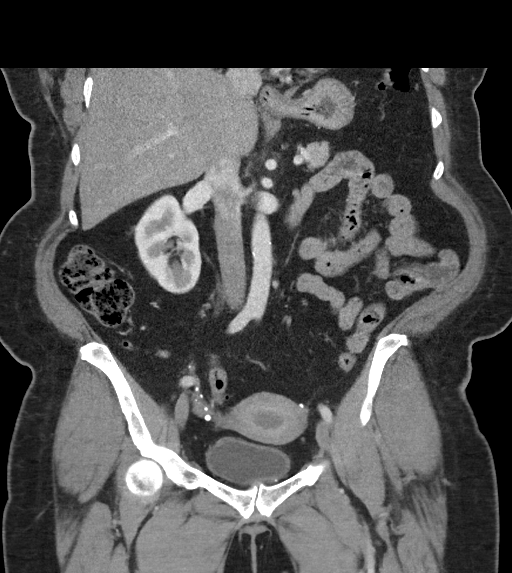

[15 of 46 positions shown; findings below may reference images not displayed]

FINDINGS: Lower chest: Linear atelectasis in the right lower lobe. No pleural
fluid or consolidation.

Hepatobiliary: Decreased hepatic density consistent with steatosis.
Questionable noncalcified gallstone in the gallbladder neck with
mild gallbladder wall thickening or pericholecystic fluid. No
biliary dilatation.

Pancreas: 19 x 13 x 22 mm cystic structure within or adjacent of the
uncinate process of the pancreas. No ductal dilatation or
inflammation.

Spleen: Normal in size without focal abnormality.

Adrenals/Urinary Tract: 24 mm left adrenal nodule. The right adrenal
gland is normal. No hydronephrosis or perinephric edema. Homogeneous
renal enhancement with symmetric excretion on delayed phase imaging.
3.5 cm cyst in the lower anterior right kidney. Urinary bladder is
physiologically distended without wall thickening.

Stomach/Bowel: Stomach is within normal limits. Appendix appears
normal. No evidence of bowel wall thickening, distention, or
inflammatory changes. Distal colonic tortuosity with minimal
diverticulosis.

Vascular/Lymphatic: Mild aortic atherosclerosis without aneurysm. No
abdominal or pelvic adenopathy.

Reproductive: Endometrial thickening versus fluid measuring 14 mm.
Ovaries are quiescent. No adnexal mass.

Other: Small fat containing umbilical hernia. No free air or
ascites.

Musculoskeletal: Near complete disc space loss at L5-S1 with grade 1
anterolisthesis. Set arthropathy in the lower lumbar spine. There
are no acute or suspicious osseous abnormalities.
IMPRESSION: 1. Possible noncalcified gallstone in the gallbladder neck with mild
gallbladder wall thickening or small amount pericholecystic fluid.
Given right upper quadrant pain, recommend right upper quadrant
ultrasound for further evaluation.
2. Cystic structure adjacent to or within the uncinate process of
the pancreas measuring 2.2 cm greatest dimension. Recommend
follow-up imaging in 6 months with contrast-enhanced MRI air
pancreas protocol CT. These may reflect sequela of prior
pancreatitis or cystic lesion.
3. Indeterminate 2.4 cm left adrenal nodule. No prior exams are
available for comparison. In the absence of malignancy history this
is probably benign, consider 12 month follow-up adrenal CT.
4. Endometrial fluid or thickening of 14 mm, abnormal for a
postmenopausal patient. Recommend initial characterization with
nonemergent pelvic ultrasound.
5. Incidental hepatic steatosis, minimal colonic diverticulosis, and
aortic atherosclerosis. Aortic Atherosclerosis ([id number]-XJR.R).

## 2018-12-14 DIAGNOSIS — H6123 Impacted cerumen, bilateral: Secondary | ICD-10-CM | POA: Diagnosis not present

## 2018-12-14 DIAGNOSIS — R69 Illness, unspecified: Secondary | ICD-10-CM | POA: Diagnosis not present

## 2019-01-09 DIAGNOSIS — M1712 Unilateral primary osteoarthritis, left knee: Secondary | ICD-10-CM | POA: Diagnosis not present

## 2019-01-09 DIAGNOSIS — M25562 Pain in left knee: Secondary | ICD-10-CM | POA: Diagnosis not present

## 2019-07-18 DIAGNOSIS — L03312 Cellulitis of back [any part except buttock]: Secondary | ICD-10-CM | POA: Diagnosis not present

## 2019-07-18 DIAGNOSIS — L2389 Allergic contact dermatitis due to other agents: Secondary | ICD-10-CM | POA: Diagnosis not present

## 2019-07-18 DIAGNOSIS — R110 Nausea: Secondary | ICD-10-CM | POA: Diagnosis not present

## 2019-10-27 IMAGING — US US ABDOMEN LIMITED
1 series · 14 of 25 positions shown · non-contrast
Comparison: Abdominal CT scan of today's date

CLINICAL DATA: Suspected gallstone in the gallbladder neck observed
on CT scan earlier today.

EXAM:
ULTRASOUND ABDOMEN LIMITED RIGHT UPPER QUADRANT

[Series 1: us abdomen limited · 0.16mm/px · 14 of 75 slices shown]
[im 1/75]
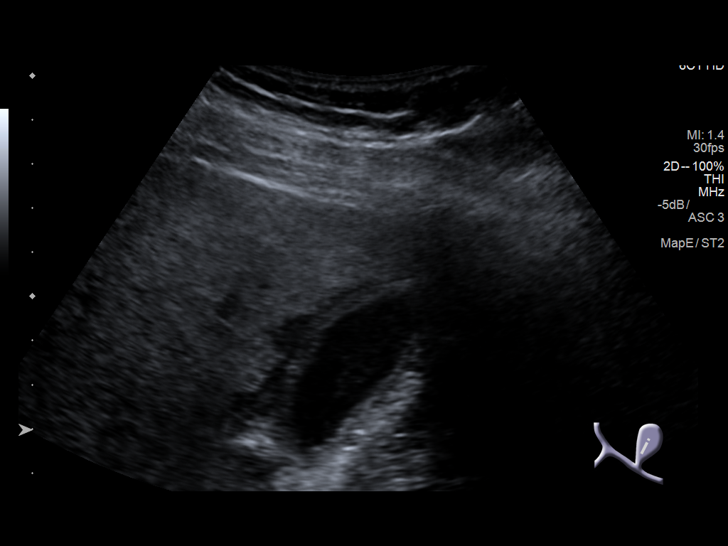
[im 7/75]
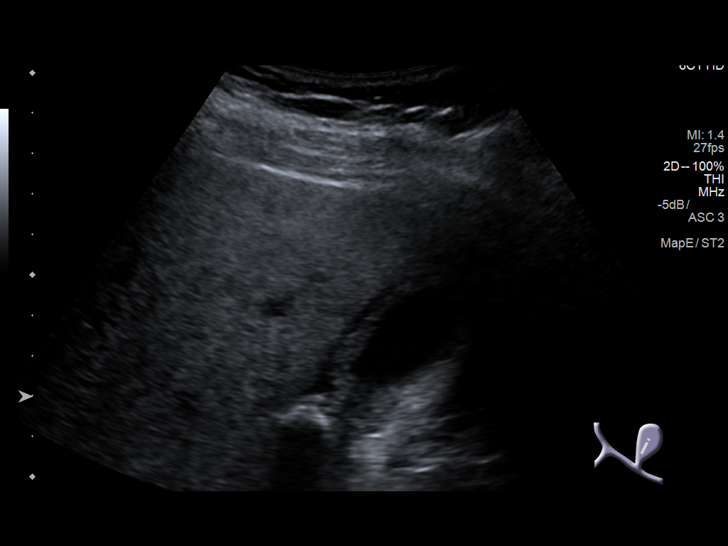
[im 13/75]
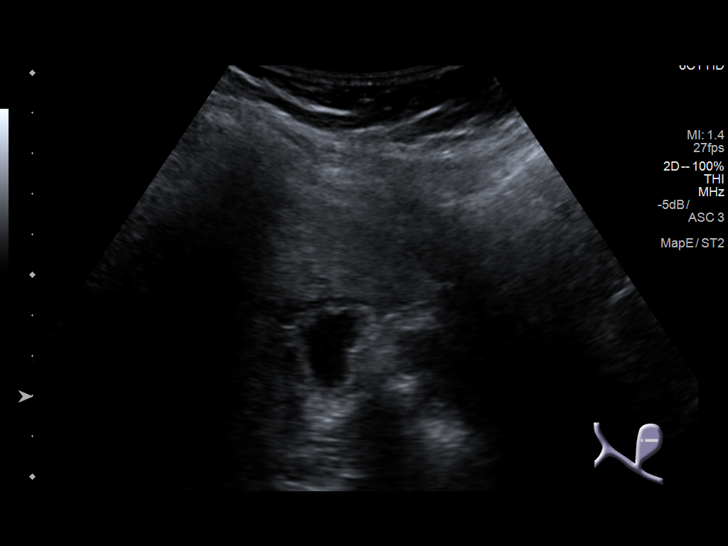
[im 19/75]
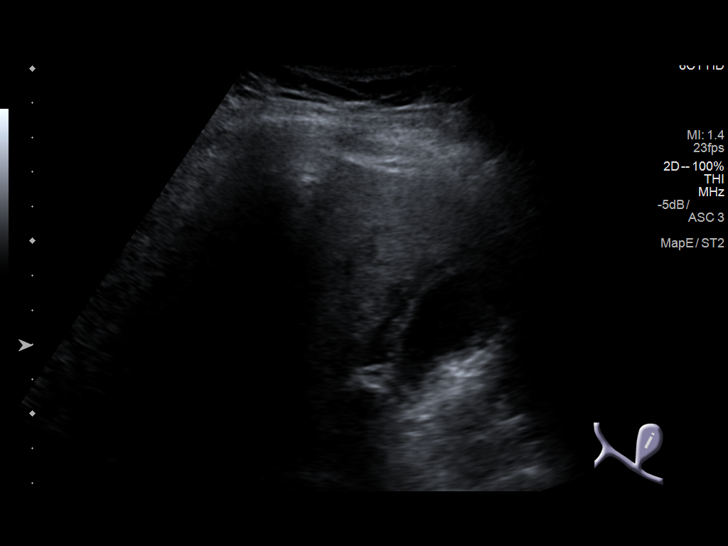
[im 25/75]
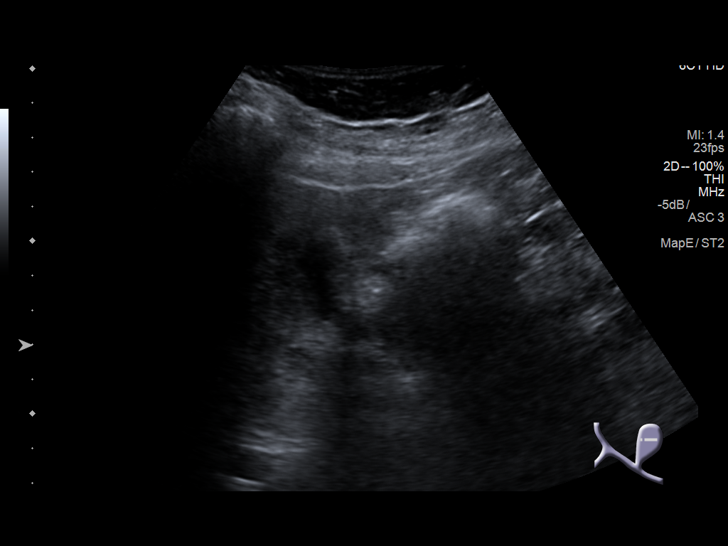
[im 28/75]
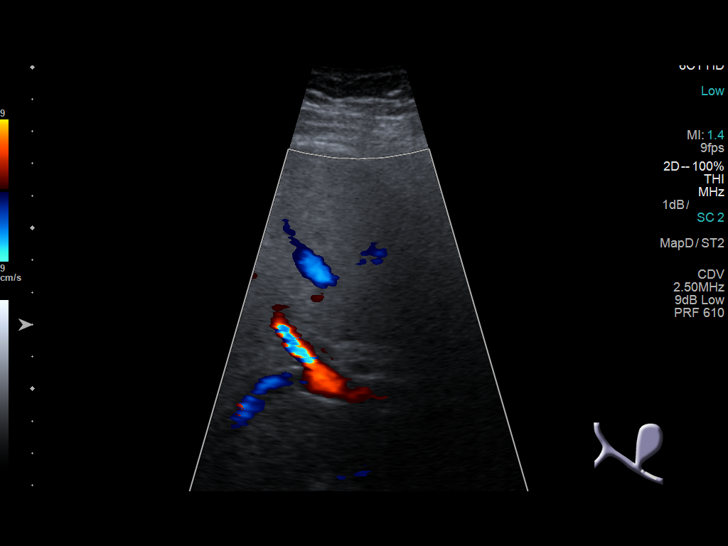
[im 34/75]
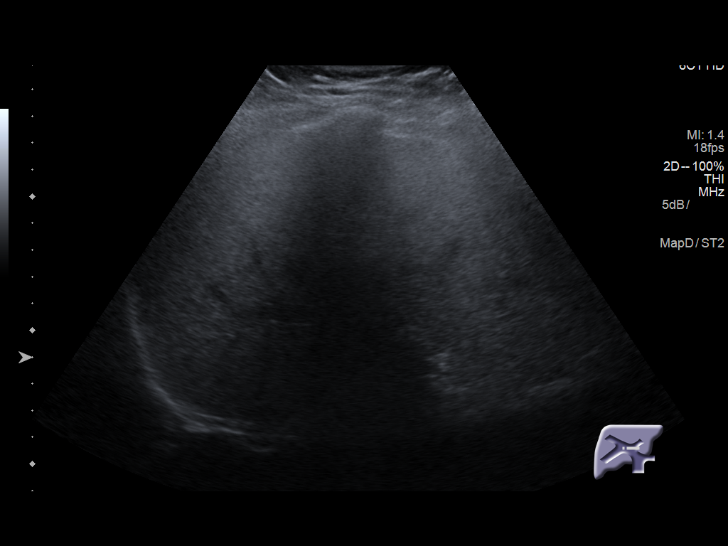
[im 41/75]
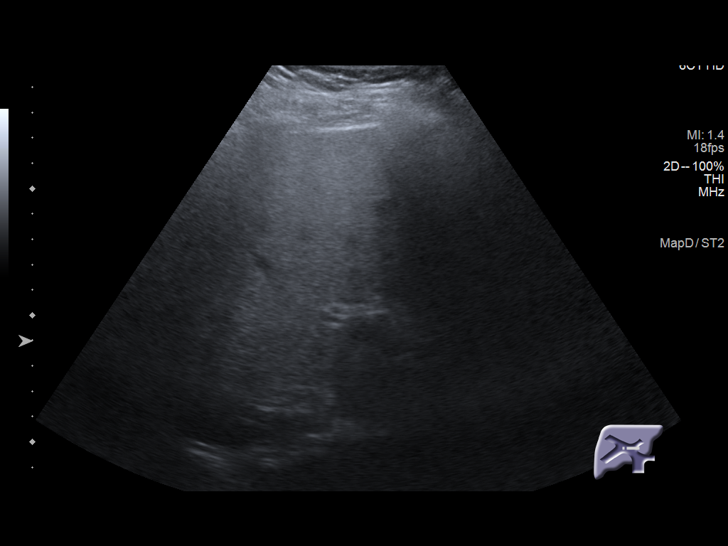
[im 47/75]
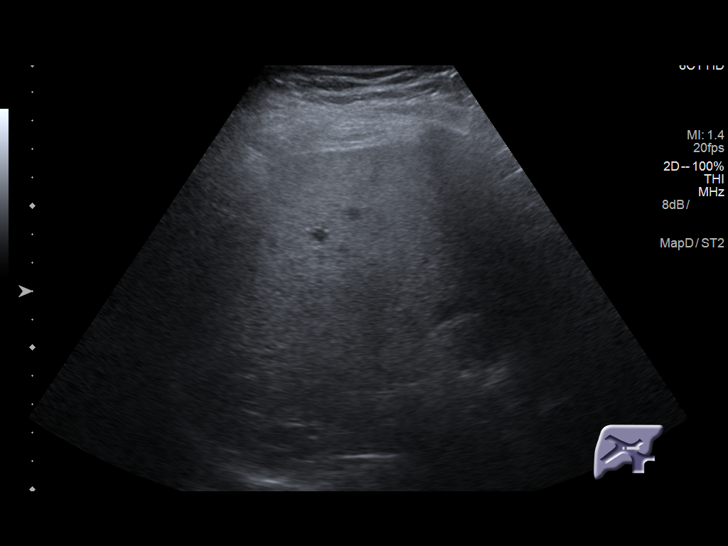
[im 50/75]
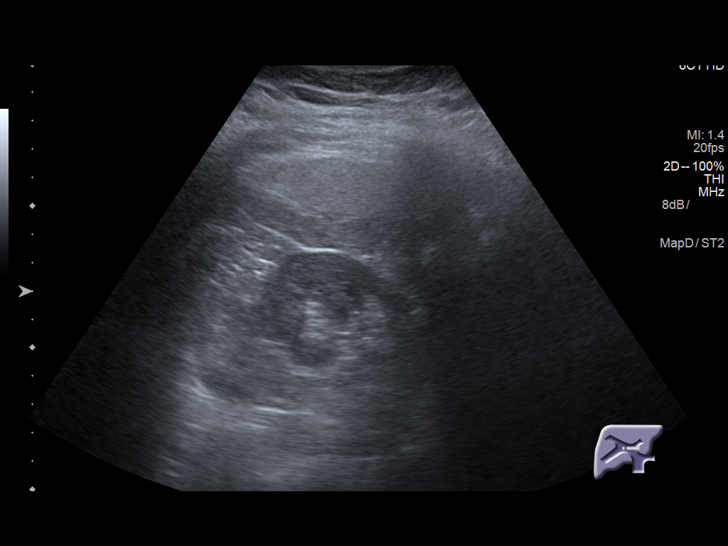
[im 56/75]
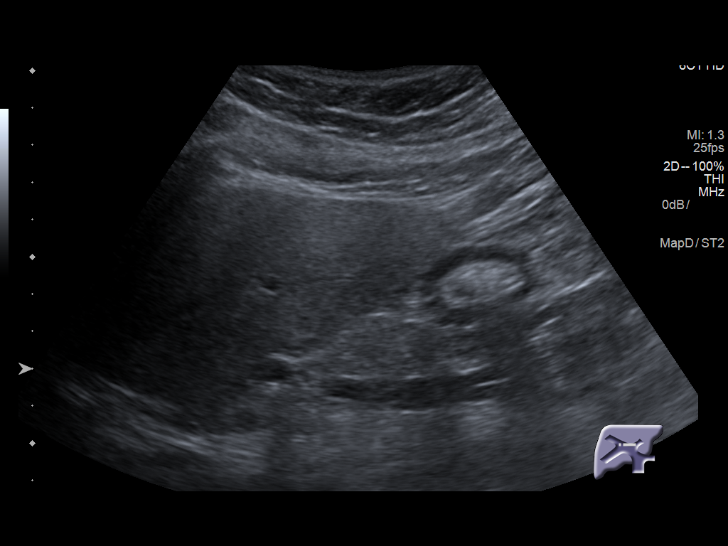
[im 62/75]
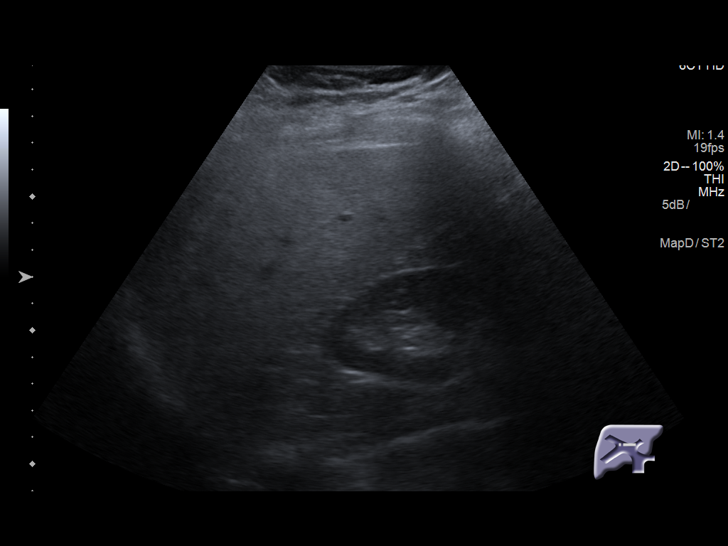
[im 68/75]
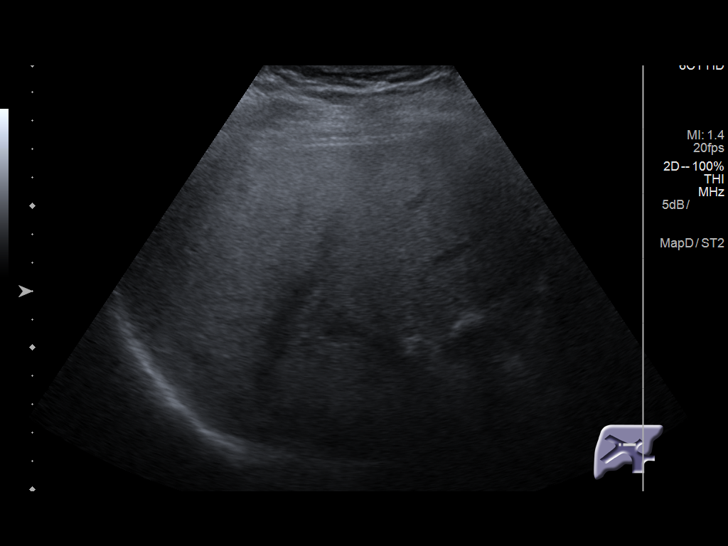
[im 75/75]
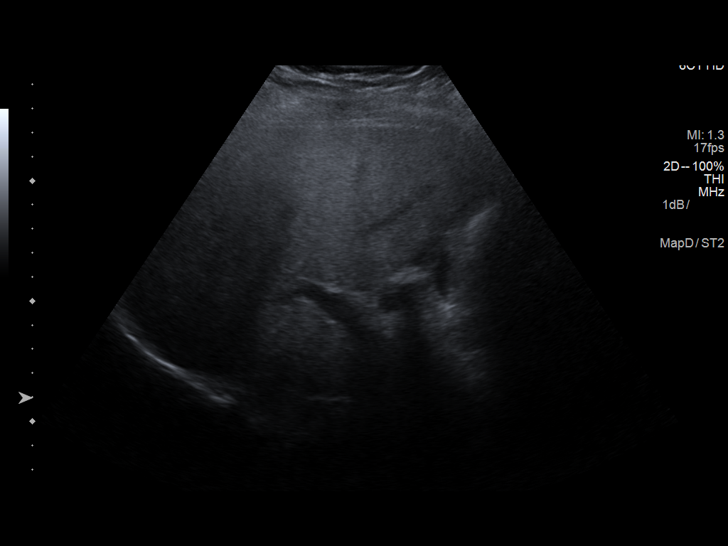

[14 of 25 positions shown; findings below may reference images not displayed]

FINDINGS: Gallbladder:

The gallbladder is adequately distended. There is a 16 mm stone
impacted in the gallbladder neck exhibiting distal shadowing. A
small amount of sludge is present elsewhere in the gallbladder.
There is minimal gallbladder wall thickening to 3.2 mm, but there is
pericholecystic fluid. There is no positive sonographic Murphy's
sign.

Common bile duct:

Diameter: 4 mm.  No intraluminal echoes are observed.

Liver:

The hepatic echotexture is mildly increased diffusely. There is no
focal mass nor ductal dilation. Portal vein is patent on color
Doppler imaging with normal direction of blood flow towards the
liver.
IMPRESSION: 1.6 cm gallstone which appears impacted in the gallbladder neck.
Small amount of pericholecystic fluid and gallbladder wall
thickening. No positive sonographic Murphy's sign. The findings may
reflect subacute or chronic cholecystitis.

Increased hepatic echotexture most compatible with fatty
infiltrative change.

## 2019-10-29 DIAGNOSIS — L728 Other follicular cysts of the skin and subcutaneous tissue: Secondary | ICD-10-CM | POA: Diagnosis not present

## 2019-10-29 DIAGNOSIS — H0260 Xanthelasma of unspecified eye, unspecified eyelid: Secondary | ICD-10-CM | POA: Diagnosis not present

## 2019-10-29 DIAGNOSIS — D2310 Other benign neoplasm of skin of unspecified eyelid, including canthus: Secondary | ICD-10-CM | POA: Diagnosis not present

## 2019-10-29 DIAGNOSIS — L814 Other melanin hyperpigmentation: Secondary | ICD-10-CM | POA: Diagnosis not present

## 2019-10-29 DIAGNOSIS — L570 Actinic keratosis: Secondary | ICD-10-CM | POA: Diagnosis not present

## 2020-02-05 DIAGNOSIS — J069 Acute upper respiratory infection, unspecified: Secondary | ICD-10-CM | POA: Diagnosis not present

## 2020-02-05 DIAGNOSIS — Z1152 Encounter for screening for COVID-19: Secondary | ICD-10-CM | POA: Diagnosis not present

## 2020-02-06 DIAGNOSIS — Z1152 Encounter for screening for COVID-19: Secondary | ICD-10-CM | POA: Diagnosis not present

## 2020-02-06 DIAGNOSIS — R067 Sneezing: Secondary | ICD-10-CM | POA: Diagnosis not present

## 2020-02-06 DIAGNOSIS — R0981 Nasal congestion: Secondary | ICD-10-CM | POA: Diagnosis not present

## 2020-02-06 DIAGNOSIS — R059 Cough, unspecified: Secondary | ICD-10-CM | POA: Diagnosis not present

## 2020-03-03 ENCOUNTER — Ambulatory Visit: Payer: Medicare HMO | Admitting: Adult Health

## 2020-03-03 ENCOUNTER — Encounter: Payer: Self-pay | Admitting: Adult Health

## 2020-03-03 ENCOUNTER — Other Ambulatory Visit: Payer: Self-pay

## 2020-03-03 VITALS — BP 150/88 | HR 85 | Ht 64.0 in | Wt 221.0 lb

## 2020-03-03 DIAGNOSIS — N950 Postmenopausal bleeding: Secondary | ICD-10-CM | POA: Diagnosis not present

## 2020-03-03 NOTE — Progress Notes (Signed)
  Subjective:     Patient ID: Madison Garrison, female   DOB: [birth year], 69 y.o.   MRN: [id number]  HPI Madison Garrison is a 69 year old white female, divorced, PM,in complaining spotting for about 2 weeks daily and has stopped. She has had before and had biopsy with Dr Emelda Fear and had endometrioid polyp.  PCP is Dr Madison Garrison.  Review of Systems Has had spotting for about 2 weeks, but has stopped Some low back pain but has been lifting cinder blocks Reviewed past medical,surgical, social and family history. Reviewed medications and allergies.     Objective:   Physical Exam BP (!) 150/88 (BP Location: Left Arm, Patient Position: Sitting, Cuff Size: Normal)   Pulse 85   Ht 5\' 4"  (1.626 m)   Wt 221 lb (100.2 kg)   BMI 37.93 kg/m  Skin warm and dry.Pelvic: external genitalia is normal in appearance no lesions, vagina: scant brown discharge without odor,urethra has no lesions or masses noted, cervix:smooth, uterus: normal size, shape and contour, non tender, no masses felt, adnexa: no masses or tenderness noted. Bladder is non tender and no masses felt.  [facility]stream - 03/03/20 1047      Pregnancy Intention Screening   Does the patient want to become pregnant in the next year? No    Does the patient's partner want to become pregnant in the next year? No    Would the patient like to discuss contraceptive options today? No      Contraception Wrap Up   Current Method No Method - Other Reason   PM   End Method No Method - Other Reason   PM   Contraception Counseling Provided No         Examination chaperoned by 03/05/20 LPN    Assessment:     1. PMB (postmenopausal bleeding) Will get GYN Madison Garrison in office to assess uterine lining,she is aware that if lining thickened will need endometrial biopsy,she has had before     Plan:   Will get GYN [place] in about 2 weeks and talk when results back

## 2020-03-05 DIAGNOSIS — H6122 Impacted cerumen, left ear: Secondary | ICD-10-CM | POA: Diagnosis not present

## 2020-03-17 ENCOUNTER — Other Ambulatory Visit: Payer: Self-pay

## 2020-03-17 ENCOUNTER — Ambulatory Visit (INDEPENDENT_AMBULATORY_CARE_PROVIDER_SITE_OTHER): Payer: Medicare HMO

## 2020-03-17 DIAGNOSIS — N950 Postmenopausal bleeding: Secondary | ICD-10-CM

## 2020-03-17 NOTE — Progress Notes (Signed)
PELVIC US TA/TV: homogeneous anteverted uterus,wnl,homogeneous thickened endometrium 16 mm,normal ovaries,ovaries appear mobile,no free fluid,no pain during ultrasound

## 2020-03-18 ENCOUNTER — Telehealth: Payer: Self-pay | Admitting: Adult Health

## 2020-03-18 NOTE — Telephone Encounter (Signed)
Pt aware that US showed thickened endometrium of 16 mm, uterus and ovaries looked normal, will get endometrial biopsy appt with Dr Rip Harbour

## 2020-03-25 ENCOUNTER — Other Ambulatory Visit: Payer: Self-pay

## 2020-03-25 ENCOUNTER — Encounter: Payer: Self-pay | Admitting: Obstetrics and Gynecology

## 2020-03-25 ENCOUNTER — Ambulatory Visit (INDEPENDENT_AMBULATORY_CARE_PROVIDER_SITE_OTHER): Payer: Medicare HMO | Admitting: Obstetrics and Gynecology

## 2020-03-25 VITALS — BP 154/86 | HR 85 | Ht 64.0 in | Wt 215.2 lb

## 2020-03-25 DIAGNOSIS — R9389 Abnormal findings on diagnostic imaging of other specified body structures: Secondary | ICD-10-CM | POA: Diagnosis not present

## 2020-03-25 DIAGNOSIS — N950 Postmenopausal bleeding: Secondary | ICD-10-CM | POA: Diagnosis not present

## 2020-03-25 NOTE — Progress Notes (Signed)
Pt here for EMBX d/t to PMB. She reports that this is the third time in the last few years. Previous Bx normal. She is frustrated having to go through this every few years FH of ovarian cancer in sister She is interested in hysterectomy  PE AF VSS GU Nl EGBUS, atrophic vaginal mucosa, cervical os stenotic and unable to dilate due to pt comfort, EMBX not completed  A/P PMB/thicken endometrium Unable to complete EMBX as noted above. Discussed options of using Cytotec vaginal night prior to and reattempt EMBX. Also discussed hysteroscopy D & C. Pt desires hysteroscopy D & C. Will schedule with Dr Elonda Husky

## 2020-03-27 NOTE — Patient Instructions (Signed)
Madison Garrison  03/27/2020     @PREFPERIOPPHARMACY @   Your procedure is scheduled on  04/02/2020.    Report to [facility] at  [phone]  A.M.    Call this number if you have problems the morning of surgery:  [phone]     Do not eat or drink after midnight.                        Take these medicines the morning of surgery with A SIP OF WATER  None    Please brush your teeth.  Do not wear jewelry, make-up or nail polish.  Do not wear lotions, powders, or perfumes, or deodorant.  Do not shave 48 hours prior to surgery.  Men may shave face and neck.  Do not bring valuables to the hospital.  [facility] is not responsible for any belongings or valuables.  Contacts, dentures or bridgework may not be worn into surgery.  Leave your suitcase in the car.  After surgery it may be brought to your room.  For patients admitted to the hospital, discharge time will be determined by your treatment team.  Patients discharged the day of surgery will not be allowed to drive home and they will need someone with them for 24 hours.     Special instructions:  DO NOT smoke (tobacco or vape) the morning of your procedure.  Shower the night before and the morning of your procedure with CHG. DO NOT use CHG on your face, hair or genitals(privates) or any open sores or wounds.   Dry off after each shower with a clean towel.  After your night time shower put on clean clothes and clean sheets on your bed before you go to sleep.    After your morning shower, brush your teeth and put on clean comfortable clothes to wear to the hospital.   Please read over the following fact sheets that you were given. Surgical Site Infection Prevention, Anesthesia Post-op Instructions and Care and Recovery After Surgery       Dilation and Curettage or Vacuum Curettage, Care After This sheet gives you information about how to care for yourself after your procedure. Your doctor may also give you more  specific instructions. If you have problems or questions, contact your doctor. What can I expect after the procedure? After the procedure, it is common to have:  Mild pain or cramping.  Some bleeding or spotting from the vagina. These may last for up to 2 weeks. Follow these instructions at home: Medicines  Take over-the-counter and prescription medicines only as told by your doctor. This is very important if you take blood-thinning medicine.  Ask your doctor if the medicine prescribed to you requires you to avoid driving or using machinery. Activity  If you were given a medicine to help you relax (sedative) during your procedure, it can affect you for many hours. Do not drive or use machinery until your doctor says that it is safe.  Rest as told by your doctor.  Do not sit for a long time without moving. Get up to take short walks every 1-2 hours. This is important. Ask for help if you feel weak or unsteady.  Do not lift anything that is heavier than 10 lb (4.5 kg), or the limit that you are told, until your doctor says that it is safe.  Return to your normal activities as told by your doctor. Ask your  doctor what activities are safe for you.   Lifestyle For at least 2 weeks, or as long as told by your doctor:  Do not douche.  Do not use tampons.  Do not have sex. General instructions  Wear compression stockings as told by your doctor.  It is up to you to get the results of your procedure. Ask your doctor, or the department that is doing the procedure, when your results will be ready.  Keep all follow-up visits as told by your doctor. This is important. Contact a doctor if:  You have very bad cramps that get worse or do not get better with medicine.  You have very bad pain in your belly (abdomen).  You cannot drink fluids without vomiting.  You have pain in a different part of your pelvis. The pelvis is the area just above your thighs.  You have fluid from your  vagina that smells bad.  You have a rash. Get help right away if:  You are bleeding a lot from your vagina. A lot of bleeding means soaking more than one sanitary pad in 1 hour for 2 hours in a row.  You have a fever that is above 100.72F (38.0C).  Your belly feels very tender or hard.  You have chest pain.  You have trouble breathing.  You feel dizzy.  You feel light-headed.  You pass out (faint).  You have pain in your neck or shoulder area. These symptoms may be an emergency. Do not wait to see if the symptoms will go away. Get medical help right away. Call your local emergency services (911 in the [place] Do not drive yourself to the hospital. Summary  After your procedure, it is common to have pain or cramping. It is also common to have bleeding or spotting from your vagina.  Rest as told. Do not sit for a long time without moving. Get up to take short walks every 1-2 hours.  Do not lift anything that is heavier than 10 lb (4.5 kg), or the limit that you are told.  Contact your doctor if you have fluid from your vagina that smells bad.  Get help right away if you develop any problems from the procedure. Ask your doctor what problems to watch for. This information is not intended to replace advice given to you by your health care provider. Make sure you discuss any questions you have with your health care provider. Document Revised: 03/26/2019 Document Reviewed: 03/26/2019 Elsevier Patient Education  2021 [place] Anesthesia, Adult, Care After This sheet gives you information about how to care for yourself after your procedure. Your health care provider may also give you more specific instructions. If you have problems or questions, contact your health care provider. What can I expect after the procedure? After the procedure, the following side effects are common:  Pain or discomfort at the IV site.  Nausea.  Vomiting.  Sore throat.  Trouble  concentrating.  Feeling cold or chills.  Feeling weak or tired.  Sleepiness and fatigue.  Soreness and body aches. These side effects can affect parts of the body that were not involved in surgery. Follow these instructions at home: For the time period you were told by your health care provider:  Rest.  Do not participate in activities where you could fall or become injured.  Do not drive or use machinery.  Do not drink alcohol.  Do not take sleeping pills or medicines that cause drowsiness.  Do not make  important decisions or sign legal documents.  Do not take care of children on your own.   Eating and drinking  Follow any instructions from your health care provider about eating or drinking restrictions.  When you feel hungry, start by eating small amounts of foods that are soft and easy to digest (bland), such as toast. Gradually return to your regular diet.  Drink enough fluid to keep your urine pale yellow.  If you vomit, rehydrate by drinking water, juice, or clear broth. General instructions  If you have sleep apnea, surgery and certain medicines can increase your risk for breathing problems. Follow instructions from your health care provider about wearing your sleep device: ? Anytime you are sleeping, including during daytime naps. ? While taking prescription pain medicines, sleeping medicines, or medicines that make you drowsy.  Have a responsible adult stay with you for the time you are told. It is important to have someone help care for you until you are awake and alert.  Return to your normal activities as told by your health care provider. Ask your health care provider what activities are safe for you.  Take over-the-counter and prescription medicines only as told by your health care provider.  If you smoke, do not smoke without supervision.  Keep all follow-up visits as told by your health care provider. This is important. Contact a health care provider  if:  You have nausea or vomiting that does not get better with medicine.  You cannot eat or drink without vomiting.  You have pain that does not get better with medicine.  You are unable to pass urine.  You develop a skin rash.  You have a fever.  You have redness around your IV site that gets worse. Get help right away if:  You have difficulty breathing.  You have chest pain.  You have blood in your urine or stool, or you vomit blood. Summary  After the procedure, it is common to have a sore throat or nausea. It is also common to feel tired.  Have a responsible adult stay with you for the time you are told. It is important to have someone help care for you until you are awake and alert.  When you feel hungry, start by eating small amounts of foods that are soft and easy to digest (bland), such as toast. Gradually return to your regular diet.  Drink enough fluid to keep your urine pale yellow.  Return to your normal activities as told by your health care provider. Ask your health care provider what activities are safe for you. This information is not intended to replace advice given to you by your health care provider. Make sure you discuss any questions you have with your health care provider. Document Revised: 11/07/2019 Document Reviewed: 06/06/2019 Elsevier Patient Education  2021 [employer].

## 2020-03-29 ENCOUNTER — Other Ambulatory Visit: Payer: Self-pay | Admitting: Obstetrics & Gynecology

## 2020-03-31 ENCOUNTER — Other Ambulatory Visit (HOSPITAL_COMMUNITY)
Admission: RE | Admit: 2020-03-31 | Discharge: 2020-03-31 | Disposition: A | Discharge status: Home / Self Care | Payer: Medicare HMO | Source: Ambulatory Visit | Attending: Obstetrics & Gynecology | Admitting: Obstetrics & Gynecology

## 2020-03-31 ENCOUNTER — Other Ambulatory Visit: Payer: Self-pay

## 2020-03-31 ENCOUNTER — Encounter (HOSPITAL_COMMUNITY): Payer: Self-pay

## 2020-03-31 ENCOUNTER — Encounter (HOSPITAL_COMMUNITY)
Admission: RE | Admit: 2020-03-31 | Discharge: 2020-03-31 | Disposition: A | Discharge status: Home / Self Care | Payer: Medicare HMO | Source: Ambulatory Visit | Attending: Obstetrics & Gynecology | Admitting: Obstetrics & Gynecology

## 2020-03-31 DIAGNOSIS — Z20822 Contact with and (suspected) exposure to covid-19: Secondary | ICD-10-CM | POA: Diagnosis not present

## 2020-03-31 DIAGNOSIS — Z01818 Encounter for other preprocedural examination: Secondary | ICD-10-CM | POA: Insufficient documentation

## 2020-03-31 LAB — RAPID HIV SCREEN (HIV 1/2 AB+AG)
HIV 1/2 Antibodies: NONREACTIVE
HIV-1 P24 Antigen - HIV24: NONREACTIVE

## 2020-03-31 LAB — COMPREHENSIVE METABOLIC PANEL
ALT: 27 U/L (ref 0–44)
AST: 23 U/L (ref 15–41)
Albumin: 3.9 g/dL (ref 3.5–5.0)
Alkaline Phosphatase: 85 U/L (ref 38–126)
Anion gap: 7 (ref 5–15)
BUN: 15 mg/dL (ref 8–23)
CO2: 28 mmol/L (ref 22–32)
Calcium: 9.3 mg/dL (ref 8.9–10.3)
Chloride: 103 mmol/L (ref 98–111)
Creatinine, Ser: 0.73 mg/dL (ref 0.44–1.00)
GFR, Estimated: >60 mL/min (ref >60)
Glucose, Bld: 116 mg/dL — ABNORMAL HIGH (ref 70–99)
Potassium: 3.7 mmol/L (ref 3.5–5.1)
Sodium: 138 mmol/L (ref 135–145)
Total Bilirubin: 0.7 mg/dL (ref 0.3–1.2)
Total Protein: 7.4 g/dL (ref 6.5–8.1)

## 2020-03-31 LAB — URINALYSIS, ROUTINE W REFLEX MICROSCOPIC
Bilirubin Urine: NEGATIVE
Glucose, UA: NEGATIVE mg/dL
Hgb urine dipstick: NEGATIVE
Ketones, ur: NEGATIVE mg/dL
Leukocytes,Ua: NEGATIVE
Nitrite: NEGATIVE
Protein, ur: NEGATIVE mg/dL
Specific Gravity, Urine: 1.003 — ABNORMAL LOW (ref 1.005–1.030)
pH: 7 (ref 5.0–8.0)

## 2020-03-31 LAB — CBC
HCT: 45.6 % (ref 36.0–46.0)
Hemoglobin: 14.5 g/dL (ref 12.0–15.0)
MCH: 29.1 pg (ref 26.0–34.0)
MCHC: 31.8 g/dL (ref 30.0–36.0)
MCV: 91.4 fL (ref 80.0–100.0)
Platelets: 182 10*3/uL (ref 150–400)
RBC: 4.99 MIL/uL (ref 3.87–5.11)
RDW: 14.4 % (ref 11.5–15.5)
WBC: 5.3 10*3/uL (ref 4.0–10.5)
nRBC: 0 % (ref 0.0–0.2)

## 2020-03-31 LAB — SARS CORONAVIRUS 2 (TAT 6-24 HRS): SARS Coronavirus 2: NEGATIVE

## 2020-04-01 DIAGNOSIS — I10 Essential (primary) hypertension: Secondary | ICD-10-CM | POA: Diagnosis not present

## 2020-04-01 DIAGNOSIS — R69 Illness, unspecified: Secondary | ICD-10-CM | POA: Diagnosis not present

## 2020-04-02 ENCOUNTER — Ambulatory Visit (HOSPITAL_COMMUNITY): Payer: Medicare HMO | Admitting: Certified Registered"

## 2020-04-02 ENCOUNTER — Encounter (HOSPITAL_COMMUNITY)
Admission: RE | Disposition: A | Discharge status: Home / Self Care | Payer: Self-pay | Source: Home / Self Care | Attending: Obstetrics & Gynecology

## 2020-04-02 ENCOUNTER — Ambulatory Visit (HOSPITAL_COMMUNITY)
Admission: RE | Admit: 2020-04-02 | Discharge: 2020-04-02 | Disposition: A | Discharge status: Home / Self Care | Payer: Medicare HMO | Attending: Obstetrics & Gynecology | Admitting: Obstetrics & Gynecology

## 2020-04-02 ENCOUNTER — Encounter (HOSPITAL_COMMUNITY): Payer: Self-pay | Admitting: Obstetrics & Gynecology

## 2020-04-02 DIAGNOSIS — Z7722 Contact with and (suspected) exposure to environmental tobacco smoke (acute) (chronic): Secondary | ICD-10-CM | POA: Diagnosis not present

## 2020-04-02 DIAGNOSIS — N950 Postmenopausal bleeding: Secondary | ICD-10-CM

## 2020-04-02 DIAGNOSIS — R9389 Abnormal findings on diagnostic imaging of other specified body structures: Secondary | ICD-10-CM | POA: Diagnosis not present

## 2020-04-02 DIAGNOSIS — Z8041 Family history of malignant neoplasm of ovary: Secondary | ICD-10-CM | POA: Diagnosis not present

## 2020-04-02 DIAGNOSIS — N840 Polyp of corpus uteri: Secondary | ICD-10-CM | POA: Diagnosis not present

## 2020-04-02 DIAGNOSIS — Z809 Family history of malignant neoplasm, unspecified: Secondary | ICD-10-CM | POA: Insufficient documentation

## 2020-04-02 HISTORY — PX: HYSTEROSCOPY WITH D & C: SHX1775

## 2020-04-02 SURGERY — DILATATION AND CURETTAGE /HYSTEROSCOPY
Anesthesia: General | Site: Uterus

## 2020-04-02 MED ORDER — CHLORHEXIDINE GLUCONATE 0.12 % MT SOLN
OROMUCOSAL | Status: AC
  Filled 2020-04-02: qty 15

## 2020-04-02 MED ORDER — GLYCOPYRROLATE PF 0.2 MG/ML IJ SOSY
PREFILLED_SYRINGE | INTRAMUSCULAR | Status: DC | PRN
  Administered 2020-04-02: .1 mg via INTRAVENOUS

## 2020-04-02 MED ORDER — ONDANSETRON HCL 4 MG/2ML IJ SOLN
INTRAMUSCULAR | Status: AC
  Filled 2020-04-02: qty 2

## 2020-04-02 MED ORDER — FENTANYL CITRATE (PF) 100 MCG/2ML IJ SOLN
25.0000 ug | INTRAMUSCULAR | Status: DC | PRN
  Administered 2020-04-02 (×2): 50 ug via INTRAVENOUS
  Filled 2020-04-02: qty 2

## 2020-04-02 MED ORDER — EPHEDRINE SULFATE-NACL 50-0.9 MG/10ML-% IV SOSY
PREFILLED_SYRINGE | INTRAVENOUS | Status: DC | PRN
  Administered 2020-04-02 (×2): 10 mg via INTRAVENOUS

## 2020-04-02 MED ORDER — KETOROLAC TROMETHAMINE 10 MG PO TABS: 10.0000 mg | ORAL_TABLET | Freq: Three times a day (TID) | ORAL | 0 refills | Status: AC | PRN

## 2020-04-02 MED ORDER — LIDOCAINE HCL (PF) 2 % IJ SOLN
INTRAMUSCULAR | Status: AC
  Filled 2020-04-02: qty 5

## 2020-04-02 MED ORDER — LACTATED RINGERS IV SOLN
INTRAVENOUS | Status: DC
  Administered 2020-04-02: 1000 mL via INTRAVENOUS

## 2020-04-02 MED ORDER — ONDANSETRON HCL 4 MG/2ML IJ SOLN
INTRAMUSCULAR | Status: DC | PRN
  Administered 2020-04-02: 4 mg via INTRAVENOUS

## 2020-04-02 MED ORDER — CEFAZOLIN SODIUM-DEXTROSE 2-4 GM/100ML-% IV SOLN
2.0000 g | INTRAVENOUS | Status: AC
  Administered 2020-04-02: 2 g via INTRAVENOUS
  Filled 2020-04-02: qty 100

## 2020-04-02 MED ORDER — EPHEDRINE 5 MG/ML INJ
INTRAVENOUS | Status: AC
  Filled 2020-04-02: qty 10

## 2020-04-02 MED ORDER — MIDAZOLAM HCL 2 MG/2ML IJ SOLN
INTRAMUSCULAR | Status: AC
  Filled 2020-04-02: qty 2

## 2020-04-02 MED ORDER — PROPOFOL 10 MG/ML IV BOLUS
INTRAVENOUS | Status: DC | PRN
  Administered 2020-04-02: 170 mg via INTRAVENOUS

## 2020-04-02 MED ORDER — MIDAZOLAM HCL 5 MG/5ML IJ SOLN
INTRAMUSCULAR | Status: DC | PRN
  Administered 2020-04-02 (×2): 1 mg via INTRAVENOUS

## 2020-04-02 MED ORDER — PHENYLEPHRINE 40 MCG/ML (10ML) SYRINGE FOR IV PUSH (FOR BLOOD PRESSURE SUPPORT)
PREFILLED_SYRINGE | INTRAVENOUS | Status: AC
  Filled 2020-04-02: qty 10

## 2020-04-02 MED ORDER — FENTANYL CITRATE (PF) 100 MCG/2ML IJ SOLN
INTRAMUSCULAR | Status: AC
  Filled 2020-04-02: qty 2

## 2020-04-02 MED ORDER — ORAL CARE MOUTH RINSE: 15.0000 mL | Freq: Once | OROMUCOSAL | Status: AC

## 2020-04-02 MED ORDER — POVIDONE-IODINE 10 % EX SWAB: 2.0000 "application " | Freq: Once | CUTANEOUS | Status: DC

## 2020-04-02 MED ORDER — FENTANYL CITRATE (PF) 100 MCG/2ML IJ SOLN
INTRAMUSCULAR | Status: DC | PRN
  Administered 2020-04-02 (×2): 50 ug via INTRAVENOUS

## 2020-04-02 MED ORDER — ONDANSETRON HCL 4 MG/2ML IJ SOLN: 4.0000 mg | Freq: Once | INTRAMUSCULAR | Status: DC | PRN

## 2020-04-02 MED ORDER — PHENYLEPHRINE 40 MCG/ML (10ML) SYRINGE FOR IV PUSH (FOR BLOOD PRESSURE SUPPORT)
PREFILLED_SYRINGE | INTRAVENOUS | Status: DC | PRN
  Administered 2020-04-02: 80 ug via INTRAVENOUS
  Administered 2020-04-02: 120 ug via INTRAVENOUS
  Administered 2020-04-02: 80 ug via INTRAVENOUS
  Administered 2020-04-02: 120 ug via INTRAVENOUS
  Administered 2020-04-02 (×3): 80 ug via INTRAVENOUS

## 2020-04-02 MED ORDER — GLYCOPYRROLATE PF 0.2 MG/ML IJ SOSY
PREFILLED_SYRINGE | INTRAMUSCULAR | Status: AC
  Filled 2020-04-02: qty 1

## 2020-04-02 MED ORDER — CHLORHEXIDINE GLUCONATE 0.12 % MT SOLN
15.0000 mL | Freq: Once | OROMUCOSAL | Status: AC
  Administered 2020-04-02: 15 mL via OROMUCOSAL

## 2020-04-02 MED ORDER — ONDANSETRON 8 MG PO TBDP: 8.0000 mg | ORAL_TABLET | Freq: Three times a day (TID) | ORAL | 0 refills | Status: AC | PRN

## 2020-04-02 MED ORDER — 0.9 % SODIUM CHLORIDE (POUR BTL) OPTIME
TOPICAL | Status: DC | PRN
  Administered 2020-04-02: 1000 mL

## 2020-04-02 MED ORDER — LIDOCAINE 2% (20 MG/ML) 5 ML SYRINGE
INTRAMUSCULAR | Status: DC | PRN
  Administered 2020-04-02: 100 mg via INTRAVENOUS

## 2020-04-02 MED ORDER — DEXAMETHASONE SODIUM PHOSPHATE 4 MG/ML IJ SOLN
INTRAMUSCULAR | Status: DC | PRN
  Administered 2020-04-02: 8 mg via INTRAVENOUS

## 2020-04-02 MED ORDER — HYDROCODONE-ACETAMINOPHEN 5-325 MG PO TABS: 1.0000 | ORAL_TABLET | Freq: Four times a day (QID) | ORAL | 0 refills | Status: AC | PRN

## 2020-04-02 MED ORDER — DEXAMETHASONE SODIUM PHOSPHATE 10 MG/ML IJ SOLN
INTRAMUSCULAR | Status: AC
  Filled 2020-04-02: qty 1

## 2020-04-02 MED ORDER — HYDROCODONE-ACETAMINOPHEN 5-325 MG PO TABS
1.0000 | ORAL_TABLET | Freq: Once | ORAL | Status: AC
Start: 2020-04-02 — End: 2020-04-02
  Administered 2020-04-02: 1 via ORAL
  Filled 2020-04-02: qty 1

## 2020-04-02 MED ORDER — SODIUM CHLORIDE 0.9 % IR SOLN
Status: DC | PRN
  Administered 2020-04-02: 3000 mL

## 2020-04-02 MED ORDER — PROPOFOL 10 MG/ML IV BOLUS
INTRAVENOUS | Status: AC
  Filled 2020-04-02: qty 40

## 2020-04-02 MED ORDER — KETOROLAC TROMETHAMINE 30 MG/ML IJ SOLN
30.0000 mg | Freq: Once | INTRAMUSCULAR | Status: AC
  Administered 2020-04-02: 30 mg via INTRAVENOUS
  Filled 2020-04-02: qty 1

## 2020-04-02 SURGICAL SUPPLY — 26 items
BAG HAMPER (MISCELLANEOUS) ×2 IMPLANT
CLOTH BEACON ORANGE TIMEOUT ST (SAFETY) ×2 IMPLANT
COVER LIGHT HANDLE STERIS (MISCELLANEOUS) ×4 IMPLANT
COVER WAND RF STERILE (DRAPES) ×4 IMPLANT
GAUZE 4X4 16PLY RFD (DISPOSABLE) ×4 IMPLANT
GLOVE ECLIPSE 6.5 STRL STRAW (GLOVE) ×1 IMPLANT
GLOVE ECLIPSE 8.0 STRL XLNG CF (GLOVE) ×2 IMPLANT
GLOVE SURG NEOPR MICRO LF SZ7 (GLOVE) ×2 IMPLANT
GLOVE SURG UNDER POLY LF SZ7 (GLOVE) ×2 IMPLANT
GOWN STRL REUS W/TWL LRG LVL3 (GOWN DISPOSABLE) ×2 IMPLANT
GOWN STRL REUS W/TWL XL LVL3 (GOWN DISPOSABLE) ×2 IMPLANT
INST SET HYSTEROSCOPY (KITS) ×2 IMPLANT
IV NS 1000ML (IV SOLUTION) ×2
IV NS 1000ML BAXH (IV SOLUTION) ×1 IMPLANT
KIT TURNOVER CYSTO (KITS) ×2 IMPLANT
MANIFOLD NEPTUNE II (INSTRUMENTS) ×2 IMPLANT
MARKER SKIN DUAL TIP RULER LAB (MISCELLANEOUS) ×2 IMPLANT
NS IRRIG 1000ML POUR BTL (IV SOLUTION) ×2 IMPLANT
PACK BASIC III (CUSTOM PROCEDURE TRAY) ×2
PACK SRG BSC III STRL LF ECLPS (CUSTOM PROCEDURE TRAY) ×1 IMPLANT
PAD ARMBOARD 7.5X6 YLW CONV (MISCELLANEOUS) ×2 IMPLANT
PAD TELFA 3X4 1S STER (GAUZE/BANDAGES/DRESSINGS) ×3 IMPLANT
SET BASIN LINEN APH (SET/KITS/TRAYS/PACK) ×2 IMPLANT
SET IRRIG Y TYPE TUR BLADDER L (SET/KITS/TRAYS/PACK) ×2 IMPLANT
SHEET LAVH (DRAPES) ×2 IMPLANT
YANKAUER SUCT BULB TIP 10FT TU (MISCELLANEOUS) ×2 IMPLANT

## 2020-04-02 NOTE — Addendum Note (Signed)
Addendum  created 04/02/20 1102 by Orlie Dakin, CRNA   Clinical Note Signed

## 2020-04-02 NOTE — H&P (Signed)
Preoperative History and Physical  Madison Garrison is a 70 y.o. G3P3 with No LMP recorded. Patient is postmenopausal. admitted for a hysteroscopy and uterine curettage.   The patient had a similar work-up in the spring 2019 A CT scan done for another reason was performed and revealed a thickened endometrial stripe She had a 15 mm stripe at that time and sonogram Dr. Glo Herring performed an endometrial biopsy in the office which returned as benign with polypoid changes Specifically the sonogram did not show a polyp on a stalk or single vascular pedicle that would be suggestive of such At that time Dr. Glo Herring and the patient decided manage conservatively and not proceed with a hysteroscopy D&C However at this point given the recurrence and also given the fact that the endometrial biopsy in the office could not be performed by Dr. Rip Harbour I will proceed with definitive endometrial evaluation with hysteroscopy and uterine curettage which should also be definitive management of polyp should they be present  PMH:    Past Medical History:  Diagnosis Date  . Cholelithiasis   . Hyperlipidemia   . Hypertension     PSH:     Past Surgical History:  Procedure Laterality Date  . BACK SURGERY    . CHOLECYSTECTOMY N/A 01/04/2017   Procedure: LAPAROSCOPIC CHOLECYSTECTOMY;  Surgeon: Aviva Signs, MD;  Location: [facility];  Service: General;  Laterality: N/A;  . ENDOMETRIAL BIOPSY  2011  . HERNIA REPAIR    . KNEE ARTHROSCOPY Right   . KNEE ARTHROSCOPY Left     POb/GynH:      OB History    Gravida  3   Para  3   Term      Preterm      AB      Living  2     SAB      IAB      Ectopic      Multiple      Live Births  3           SH:   Social History   Tobacco Use  . Smoking status: Passive Smoke Exposure - Never Smoker  . Smokeless tobacco: Never Used  Substance Use Topics  . Alcohol use: No    Comment: occ.  . Drug use: No    FH:    Family History  Problem Relation  Age of Onset  . Hypertension Mother   . Hypertension Father   . Kidney disease Father   . Heart disease Father   . Cancer Daughter 41       melanoma  . Hypertension Maternal Grandmother   . Hypertension Maternal Grandfather   . Hypertension Paternal Grandmother   . Hypertension Paternal Grandfather   . Cancer Sister 40       ovarian   . Diabetes Sister   . Hypertension Sister   . Mental illness Brother      Allergies: No Known Allergies  Medications:       Current Facility-Administered Medications:  .  ceFAZolin (ANCEF) IVPB 2g/100 mL premix, 2 g, Intravenous, On Call to OR, Florian Buff, MD .  lactated ringers infusion, , Intravenous, Continuous, Kiel, Coralie Keens, MD, Last Rate: 10 mL/hr at 04/02/20 0714, Continued from Pre-op at 04/02/20 0714 .  povidone-iodine 10 % swab 2 application, 2 application, Topical, Once, Keymari Sato, Mertie Clause, MD  Review of Systems:   Review of Systems  Constitutional: Negative for fever, chills, weight loss, malaise/fatigue and diaphoresis.  HENT: Negative  for hearing loss, ear pain, nosebleeds, congestion, sore throat, neck pain, tinnitus and ear discharge.   Eyes: Negative for blurred vision, double vision, photophobia, pain, discharge and redness.  Respiratory: Negative for cough, hemoptysis, sputum production, shortness of breath, wheezing and stridor.   Cardiovascular: Negative for chest pain, palpitations, orthopnea, claudication, leg swelling and PND.  Gastrointestinal: Positive for abdominal pain. Negative for heartburn, nausea, vomiting, diarrhea, constipation, blood in stool and melena.  Genitourinary: Negative for dysuria, urgency, frequency, hematuria and flank pain.  Musculoskeletal: Negative for myalgias, back pain, joint pain and falls.  Skin: Negative for itching and rash.  Neurological: Negative for dizziness, tingling, tremors, sensory change, speech change, focal weakness, seizures, loss of consciousness, weakness and headaches.   Endo/Heme/Allergies: Negative for environmental allergies and polydipsia. Does not bruise/bleed easily.  Psychiatric/Behavioral: Negative for depression, suicidal ideas, hallucinations, memory loss and substance abuse. The patient is not nervous/anxious and does not have insomnia.      PHYSICAL EXAM:  Blood pressure (!) 148/89, pulse 88, temperature 97.9 F (36.6 C), temperature source Oral, resp. rate 20, SpO2 99 %.    Vitals reviewed. Constitutional: She is oriented to person, place, and time. She appears well-developed and well-nourished.  HENT:  Head: Normocephalic and atraumatic.  Right Ear: External ear normal.  Left Ear: External ear normal.  Nose: Nose normal.  Mouth/Throat: Oropharynx is clear and moist.  Eyes: Conjunctivae and EOM are normal. Pupils are equal, round, and reactive to light. Right eye exhibits no discharge. Left eye exhibits no discharge. No scleral icterus.  Neck: Normal range of motion. Neck supple. No tracheal deviation present. No thyromegaly present.  Cardiovascular: Normal rate, regular rhythm, normal heart sounds and intact distal pulses.  Exam reveals no gallop and no friction rub.   No murmur heard. Respiratory: Effort normal and breath sounds normal. No respiratory distress. She has no wheezes. She has no rales. She exhibits no tenderness.  GI: Soft. Bowel sounds are normal. She exhibits no distension and no mass. There is tenderness. There is no rebound and no guarding.  Genitourinary:       Vulva is normal without lesions Vagina is pink moist without discharge Cervix normal in appearance and pap is normal Uterus is normal size, contour, position, consistency, mobility, non-tender Adnexa is negative with normal sized ovaries by sonogram  Musculoskeletal: Normal range of motion. She exhibits no edema and no tenderness.  Neurological: She is alert and oriented to person, place, and time. She has normal reflexes. She displays normal reflexes. No  cranial nerve deficit. She exhibits normal muscle tone. Coordination normal.  Skin: Skin is warm and dry. No rash noted. No erythema. No pallor.  Psychiatric: She has a normal mood and affect. Her behavior is normal. Judgment and thought content normal.    Labs: Results for orders placed or performed during the hospital encounter of 03/31/20 (from the past 336 hour(s))  SARS CORONAVIRUS 2 (TAT 6-24 HRS) Nasopharyngeal Nasopharyngeal Swab   Collection Time: 03/31/20 10:03 AM   Specimen: Nasopharyngeal Swab  Result Value Ref Range   SARS Coronavirus 2 NEGATIVE NEGATIVE  CBC   Collection Time: 03/31/20 10:16 AM  Result Value Ref Range   WBC 5.3 4.0 - 10.5 K/uL   RBC 4.99 3.87 - 5.11 MIL/uL   Hemoglobin 14.5 12.0 - 15.0 g/dL   HCT 45.6 36.0 - 46.0 %   MCV 91.4 80.0 - 100.0 fL   MCH 29.1 26.0 - 34.0 pg   MCHC 31.8 30.0 - 36.0 g/dL  RDW 14.4 11.5 - 15.5 %   Platelets 182 150 - 400 K/uL   nRBC 0.0 0.0 - 0.2 %  Comprehensive metabolic panel   Collection Time: 03/31/20 10:16 AM  Result Value Ref Range   Sodium 138 135 - 145 mmol/L   Potassium 3.7 3.5 - 5.1 mmol/L   Chloride 103 98 - 111 mmol/L   CO2 28 22 - 32 mmol/L   Glucose, Bld 116 (H) 70 - 99 mg/dL   BUN 15 8 - 23 mg/dL   Creatinine, Ser 0.73 0.44 - 1.00 mg/dL   Calcium 9.3 8.9 - 10.3 mg/dL   Total Protein 7.4 6.5 - 8.1 g/dL   Albumin 3.9 3.5 - 5.0 g/dL   AST 23 15 - 41 U/L   ALT 27 0 - 44 U/L   Alkaline Phosphatase 85 38 - 126 U/L   Total Bilirubin 0.7 0.3 - 1.2 mg/dL   GFR, Estimated >60 >60 mL/min   Anion gap 7 5 - 15  Rapid HIV screen (HIV 1/2 Ab+Ag)   Collection Time: 03/31/20 10:16 AM  Result Value Ref Range   HIV-1 P24 Antigen - HIV24 NON REACTIVE NON REACTIVE   HIV 1/2 Antibodies NON REACTIVE NON REACTIVE   Interpretation (HIV Ag Ab)      A non reactive test result means that HIV 1 or HIV 2 antibodies and HIV 1 p24 antigen were not detected in the specimen.  Urinalysis, Routine w reflex microscopic Urine, Clean  Catch   Collection Time: 03/31/20 10:16 AM  Result Value Ref Range   Color, Urine STRAW (A) YELLOW   APPearance CLEAR CLEAR   Specific Gravity, Urine 1.003 (L) 1.005 - 1.030   pH 7.0 5.0 - 8.0   Glucose, UA NEGATIVE NEGATIVE mg/dL   Hgb urine dipstick NEGATIVE NEGATIVE   Bilirubin Urine NEGATIVE NEGATIVE   Ketones, ur NEGATIVE NEGATIVE mg/dL   Protein, ur NEGATIVE NEGATIVE mg/dL   Nitrite NEGATIVE NEGATIVE   Leukocytes,Ua NEGATIVE NEGATIVE    EKG: Orders placed or performed during the hospital encounter of 03/31/20  . EKG 12-Lead  . EKG 12-Lead  . EKG 12-Lead  . EKG 12-Lead    Imaging Studies: US PELVIS TRANSVAGINAL NON-OB (TV ONLY)  Result Date: 03/17/2020  [facility] [address] [place] [address]                                                                                                                                   GYNECOLOGIC SONOGRAM Madison Garrison is a 70 y.o. G3P3 No LMP recorded. Patient is postmenopausal. She is here for a pelvic sonogram for postmenopausal bleeding. Uterus                      7.3 x 4.4 x 5.9 cm, Total uterine volume 113 cc, homogeneous anteverted uterus,wnl Endometrium  16 mm, symmetrical, homogeneous thickened endometrium 16 mm(no color flow) Right ovary             2.2 x 2 x 2.1 cm, wnl Left ovary                1.9 x 1.8 x 2 cm, wnl No free fluid Technician Comments: PELVIC US TA/TV: homogeneous anteverted uterus,wnl,homogeneous thickened endometrium 16 mm(no color flow),normal ovaries,ovaries appear mobile,no free fluid,no pain during ultrasound [facility]one [address] Heide Guile 03/17/2020 12:28 PM Clinical Impression and recommendations: I have reviewed the sonogram results above, combined with the patient's current clinical course, below are my impressions and any appropriate recommendations for management based on the sonographic findings. Uterus is normal size shape and contour Endometrium is  thickened for a post menopausal woman, requiring sampling Ovaries are normal I size shape and contour Endometrial biopsy is recommended Florian Buff 03/17/2020 12:43 PM  US PELVIS (TRANSABDOMINAL ONLY)  Result Date: 03/17/2020  [facility] [address] [place] [address]                                                                                                                                   GYNECOLOGIC SONOGRAM Madison Garrison is a 70 y.o. G3P3 No LMP recorded. Patient is postmenopausal. She is here for a pelvic sonogram for postmenopausal bleeding. Uterus                      7.3 x 4.4 x 5.9 cm, Total uterine volume 113 cc, homogeneous anteverted uterus,wnl Endometrium          16 mm, symmetrical, homogeneous thickened endometrium 16 mm(no color flow) Right ovary             2.2 x 2 x 2.1 cm, wnl Left ovary                1.9 x 1.8 x 2 cm, wnl No free fluid Technician Comments: PELVIC US TA/TV: homogeneous anteverted uterus,wnl,homogeneous thickened endometrium 16 mm(no color flow),normal ovaries,ovaries appear mobile,no free fluid,no pain during ultrasound [facility] [address] Heide Guile 03/17/2020 12:28 PM Clinical Impression and recommendations: I have reviewed the sonogram results above, combined with the patient's current clinical course, below are my impressions and any appropriate recommendations for management based on the sonographic findings. Uterus is normal size shape and contour Endometrium is thickened for a post menopausal woman, requiring sampling Ovaries are normal I size shape and contour Endometrial biopsy is recommended Florian Buff 03/17/2020 12:43 PM     Assessment: >Postmenopausal bleeding >Thickened endometrial stripe of 16 mm on sonogram 2 weeks ago, again no evidence of endometrial polyp noted on sonogram  Plan: Proceed with definitive diagnostic and therapeutic management with hysteroscopy and uterine curettage  Patient  understands the indications for the surgery and agrees to proceed  Florian Buff 04/02/2020 7:15 AM

## 2020-04-02 NOTE — Op Note (Signed)
Preoperative diagnosis: >Postmenopausal bleeding, recurrent                                        >Thickened endometrial stripe, endometrial biopsy suggestive of                                                    endometrial polyp in 2019 however sonogram does not definitively                                              show that  Postoperative diagnosis: Same as above plus benign-appearing endometrial polyp  Procedure: Hysteroscopy uterine curettage with removal of endometrial polyp  Anesthesia: General endotracheal  Findings: Patient had a smooth benign appearing endometrium She had a single solitary polyp arising from the left lateral endometrial cavity There were no other abnormalities noted The polyp appeared to be benign  Description of operation: Patient was taken to the operating room and placed in the supine position where she underwent general endotracheal anesthesia She was placed in the low lithotomy position and prepped and draped for hysteroscopic procedure Graves speculum was placed A single-tooth tenaculum was placed on the anterior cervix The cervix was dilated serially with Hegar dilators to 23 Diagnostic hysteroscopy was then performed and revealed the solitary endometrial polyp and benign appearing endometrium Pictures were taken I then painstakingly dilated the cervix with the Hegar dilators to 31 and was able to get a narrow ring forcep into the endometrial cavity Endometrial curettage was performed using a #1 and #2 curette The Randall stone forceps was too short Endometrial curettes were ineffective The narrow ring was then to reduce and the polyp removed with 1 pass without difficulty in 3 pieces total I then made another pass with endometrial curette with uterine curettage using a #2 curette There was good uterine cry in all areas Hysteroscopy was performed at the procedure and a post polypectomy picture was taken  The single-tooth tenaculum was removed and  was cervix was hemostatic Likewise the endometrium was not bleeding The Graves speculum was removed as well  The patient tolerated the procedure well She experienced minimal blood loss She was taken to the recovery room in good stable condition All counts were correct x3 She received 2 g of Ancef and 30 mg of Toradol IV preoperatively  She will be discharged home from the PACU She will be seen in the office at the end of next week for postoperative visit She can expect some bleeding over the next few days nothing heavy as well as cramping  Florian Buff, MD 04/02/2020 8:36 AM

## 2020-04-02 NOTE — Anesthesia Postprocedure Evaluation (Signed)
Anesthesia Post Note  Patient: Madison Garrison  Procedure(s) Performed: HYSTEROSCOPY UTERINE CURETTAGE WITH REMOVAL OF ENDOMETRIAL MASS (N/A Uterus)  Patient location during evaluation: Short Stay Anesthesia Type: General Level of consciousness: awake and alert and patient cooperative Pain management: satisfactory to patient Vital Signs Assessment: post-procedure vital signs reviewed and stable Respiratory status: spontaneous breathing Cardiovascular status: stable Postop Assessment: no apparent nausea or vomiting Anesthetic complications: no   No complications documented.   Last Vitals:  Vitals:   04/02/20 0915 04/02/20 0930  BP: 110/79 114/89  Pulse: 86   Resp: 13   Temp:    SpO2:      Last Pain:  Vitals:   04/02/20 0915  TempSrc:   PainSc: 6                  Preet Perrier

## 2020-04-02 NOTE — Transfer of Care (Signed)
Immediate Anesthesia Transfer of Care Note  Patient: Madison Garrison  Procedure(s) Performed: HYSTEROSCOPY UTERINE CURETTAGE WITH REMOVAL OF ENDOMETRIAL MASS (N/A Uterus)  Patient Location: PACU  Anesthesia Type:General  Level of Consciousness: awake and patient cooperative  Airway & Oxygen Therapy: Patient Spontanous Breathing and Patient connected to nasal cannula oxygen  Post-op Assessment: Report given to RN and Post -op Vital signs reviewed and stable  Post vital signs: Reviewed and stable  Last Vitals:  Vitals Value Taken Time  BP 115/74 04/02/20 0842  Temp    Pulse 101 04/02/20 0842  Resp 16 04/02/20 0842  SpO2 98 % 04/02/20 0842  Vitals shown include unvalidated device data.  Last Pain:  Vitals:   04/02/20 0650  TempSrc: Oral  PainSc: 0-No pain      Patients Stated Pain Goal: 5 (31/54/00 8676)  Complications: No complications documented.

## 2020-04-02 NOTE — Anesthesia Procedure Notes (Signed)
Procedure Name: LMA Insertion Date/Time: 04/02/2020 7:34 AM Performed by: Orlie Dakin, CRNA Pre-anesthesia Checklist: Patient identified, Emergency Drugs available, Suction available and Patient being monitored Patient Re-evaluated:Patient Re-evaluated prior to induction Oxygen Delivery Method: Circle system utilized Preoxygenation: Pre-oxygenation with 100% oxygen Induction Type: IV induction LMA: LMA inserted LMA Size: 4.0 Tube type: Oral Number of attempts: 1 Placement Confirmation: positive ETCO2 Tube secured with: Tape Dental Injury: Teeth and Oropharynx as per pre-operative assessment

## 2020-04-02 NOTE — Discharge Instructions (Signed)
Hysteroscopy Hysteroscopy is a procedure used to look inside a woman's womb (uterus). This may be done for various reasons, including:  To look for tumors and other growths in the uterus.  To evaluate abnormal bleeding, fibroid tumors, polyps, scar tissue, or uterine cancer.  To determine why a woman is unable to get pregnant or has had repeated pregnancy losses.  To locate an IUD (intrauterine device).  To place a birth control device into the fallopian tubes. During this procedure, a thin, flexible tube with a small light and camera (hysteroscope) is used to examine the uterus. The camera sends images to a monitor in the room so that your health care provider can view the inside of your uterus. A hysteroscopy should be done right after a menstrual period. Tell a health care provider about:  Any allergies you have.  All medicines you are taking, including vitamins, herbs, eye drops, creams, and over-the-counter medicines.  Any problems you or family members have had with anesthetic medicines.  Any blood disorders you have.  Any surgeries you have had.  Any medical conditions you have.  Whether you are pregnant or may be pregnant.  Whether you have been diagnosed with an STI (sexually transmitted infection) or you think you have an STI. What are the risks? Generally, this is a safe procedure. However, problems may occur, including:  Excessive bleeding.  Infection.  Damage to the uterus or other structures or organs.  Allergic reaction to medicines or fluids that are used in the procedure. What happens before the procedure? Staying hydrated Follow instructions from your health care provider about hydration, which may include:  Up to 2 hours before the procedure - you may continue to drink clear liquids, such as water, clear fruit juice, black coffee, and plain tea. Eating and drinking restrictions Follow instructions from your health care provider about eating and  drinking, which may include:  8 hours before the procedure - stop eating solid foods and drink clear liquids only.  2 hours before the procedure - stop drinking clear liquids. Medicines  Ask your health care provider about: ? Changing or stopping your regular medicines. This is especially important if you are taking diabetes medicines or blood thinners. ? Taking medicines such as aspirin and ibuprofen. These medicines can thin your blood. Do not take these medicines unless your health care provider tells you to take them. ? Taking over-the-counter medicines, vitamins, herbs, and supplements.  Medicine may be placed in your cervix the day before the procedure. This medicine causes the cervix to open (dilate). The larger opening makes it easier for the hysteroscope to be inserted into the uterus during the procedure. General instructions  Ask your health care provider: ? What steps will be taken to help prevent infection. These steps may include:  Washing skin with a germ-killing soap.  Taking antibiotic medicine.  Do not use any products that contain nicotine or tobacco for at least 4 weeks before the procedure. These products include cigarettes, chewing tobacco, and vaping devices, such as e-cigarettes. If you need help quitting, ask your health care provider.  Plan to have a responsible adult take you home from the hospital or clinic.  Plan to have a responsible adult care for you for the time you are told after you leave the hospital or clinic. This is important.  Empty your bladder before the procedure begins. What happens during the procedure?  An IV will be inserted into one of your veins.  You may be given: ?   A medicine to help you relax (sedative). ? A medicine that numbs the area around the cervix (local anesthetic). ? A medicine to make you fall asleep (general anesthetic).  A hysteroscope will be inserted through your vagina and into your uterus.  Air or fluid will  be used to enlarge your uterus to allow your health care provider to see it better. The amount of fluid used will be carefully checked throughout the procedure.  In some cases, tissue may be gently scraped from inside the uterus and sent to a lab for testing (biopsy). The procedure may vary among health care providers and hospitals. What can I expect after the procedure?  Your blood pressure, heart rate, breathing rate, and blood oxygen level will be monitored until you leave the hospital or clinic.  You may have cramps. You may be given medicines for this.  You may have bleeding, which may vary from light spotting to menstrual-like bleeding. This is normal.  If you had a biopsy, it is up to you to get the results. Ask your health care provider, or the department that is doing the procedure, when your results will be ready. Follow these instructions at home: Activity  Rest as told by your health care provider.  Return to your normal activities as told by your health care provider. Ask your health care provider what activities are safe for you.  If you were given a sedative during the procedure, it can affect you for several hours. Do not drive or operate machinery until your health care provider says that it is safe. Medicines  Do not take aspirin or other NSAIDs during recovery, as told by your healthcare provider. It can increase the risk of bleeding.  Ask your health care provider if the medicine prescribed to you: ? Requires you to avoid driving or using machinery. ? Can cause constipation. You may need to take these actions to prevent or treat constipation:  Drink enough fluid to keep your urine pale yellow.  Take over-the-counter or prescription medicines.  Eat foods that are high in fiber, such as beans, whole grains, and fresh fruits and vegetables.  Limit foods that are high in fat and processed sugars, such as fried or sweet foods. General instructions  Do not douche,  use tampons, or have sex for 2 weeks after the procedure, or until your health care provider approves.  Do not take baths, swim, or use a hot tub until your health care provider approves. Take showers instead of baths for 2 weeks, or for as long as told by your health care provider.  Keep all follow-up visits. This is important. Contact a health care provider if:  You feel dizzy or lightheaded.  You feel nauseous.  You have abnormal vaginal discharge.  You have a rash.  You have pain that does not get better with medicine.  You have chills. Get help right away if:  You have bleeding that is heavier than a normal menstrual period.  You have a fever.  You have pain or cramps that get worse.  You develop new abdominal pain.  You faint.  You have pain in your shoulder.  You are short of breath. Summary  Hysteroscopy is a procedure that is used to look inside a woman's womb (uterus).  After the procedure, you may have bleeding, which varies from light spotting to menstrual-like bleeding. This is normal. You may also have cramps.  Do not douche, use tampons, or have sex for 2 weeks after   the procedure, or until your health care provider approves.  Plan to have a responsible adult take you home from the hospital or clinic. This information is not intended to replace advice given to you by your health care provider. Make sure you discuss any questions you have with your health care provider. Document Revised: 10/09/2019 Document Reviewed: 10/09/2019 Elsevier Patient Education  2021 [place] Anesthesia, Adult, Care After This sheet gives you information about how to care for yourself after your procedure. Your health care provider may also give you more specific instructions. If you have problems or questions, contact your health care provider. What can I expect after the procedure? After the procedure, the following side effects are common:  Pain or  discomfort at the IV site.  Nausea.  Vomiting.  Sore throat.  Trouble concentrating.  Feeling cold or chills.  Feeling weak or tired.  Sleepiness and fatigue.  Soreness and body aches. These side effects can affect parts of the body that were not involved in surgery. Follow these instructions at home: For the time period you were told by your health care provider:  Rest.  Do not participate in activities where you could fall or become injured.  Do not drive or use machinery.  Do not drink alcohol.  Do not take sleeping pills or medicines that cause drowsiness.  Do not make important decisions or sign legal documents.  Do not take care of children on your own.   Eating and drinking  Follow any instructions from your health care provider about eating or drinking restrictions.  When you feel hungry, start by eating small amounts of foods that are soft and easy to digest (bland), such as toast. Gradually return to your regular diet.  Drink enough fluid to keep your urine pale yellow.  If you vomit, rehydrate by drinking water, juice, or clear broth. General instructions  If you have sleep apnea, surgery and certain medicines can increase your risk for breathing problems. Follow instructions from your health care provider about wearing your sleep device: ? Anytime you are sleeping, including during daytime naps. ? While taking prescription pain medicines, sleeping medicines, or medicines that make you drowsy.  Have a responsible adult stay with you for the time you are told. It is important to have someone help care for you until you are awake and alert.  Return to your normal activities as told by your health care provider. Ask your health care provider what activities are safe for you.  Take over-the-counter and prescription medicines only as told by your health care provider.  If you smoke, do not smoke without supervision.  Keep all follow-up visits as told by  your health care provider. This is important. Contact a health care provider if:  You have nausea or vomiting that does not get better with medicine.  You cannot eat or drink without vomiting.  You have pain that does not get better with medicine.  You are unable to pass urine.  You develop a skin rash.  You have a fever.  You have redness around your IV site that gets worse. Get help right away if:  You have difficulty breathing.  You have chest pain.  You have blood in your urine or stool, or you vomit blood. Summary  After the procedure, it is common to have a sore throat or nausea. It is also common to feel tired.  Have a responsible adult stay with you for the time  you are told. It is important to have someone help care for you until you are awake and alert.  When you feel hungry, start by eating small amounts of foods that are soft and easy to digest (bland), such as toast. Gradually return to your regular diet.  Drink enough fluid to keep your urine pale yellow.  Return to your normal activities as told by your health care provider. Ask your health care provider what activities are safe for you. This information is not intended to replace advice given to you by your health care provider. Make sure you discuss any questions you have with your health care provider. Document Revised: 11/07/2019 Document Reviewed: 06/06/2019 Elsevier Patient Education  2021 [place].     Acetaminophen; Hydrocodone tablets or capsules What is this medicine? ACETAMINOPHEN; HYDROCODONE (a set a MEE noe fen; hye droe KOE done) is a pain reliever. It is used to treat moderate to severe pain. This medicine may be used for other purposes; ask your health care provider or pharmacist if you have questions. COMMON BRAND NAME(S): Anexsia, Bancap HC, Ceta-Plus, Co-Gesic, Comfortpak, Dolagesic, [employer], DuoCet, Hydrocet, Hydrogesic, [place], Lorcet HD, Lorcet Plus, Lortab, Margesic H,  Maxidone, [place], Polygesic, [place], [place], [place], Vicodin, Vicodin ES, Vicodin HP, Charlane Ferretti What should I tell my health care provider before I take this medicine? They need to know if you have any of these conditions:  brain tumor  drug abuse or addiction  head injury  heart disease  if you often drink alcohol  kidney disease  liver disease  low adrenal gland function  lung disease, asthma, or breathing problems  seizures  stomach or intestine problems  taken an MAOI like Marplan, Nardil, or Parnate in the last 14 days  an unusual or allergic reaction to acetaminophen, hydrocodone, other medicines, foods, dyes, or preservatives  pregnant or trying to get pregnant  breast-feeding How should I use this medicine? Take this medicine by mouth with a glass of water. Follow the directions on the prescription label. You can take it with or without food. If it upsets your stomach, take it with food. Do not take your medicine more often than directed. A special MedGuide will be given to you by the pharmacist with each prescription and refill. Be sure to read this information carefully each time. Talk to your pediatrician regarding the use of this medicine in children. Special care may be needed. Overdosage: If you think you have taken too much of this medicine contact a poison control center or emergency room at once. NOTE: This medicine is only for you. Do not share this medicine with others. What if I miss a dose? If you miss a dose, take it as soon as you can. If it is almost time for your next dose, take only that dose. Do not take double or extra doses. What may interact with this medicine? This medicine may interact with the following medications:  alcohol  antiviral medicines for HIV or AIDS  atropine  antihistamines for allergy, cough and cold  certain antibiotics like erythromycin, clarithromycin  certain medicines for anxiety or sleep  certain  medicines for bladder problems like oxybutynin, tolterodine  certain medicines for depression like amitriptyline, fluoxetine, sertraline  certain medicines for fungal infections like ketoconazole and itraconazole  certain medicines for Parkinson's disease like benztropine, trihexyphenidyl  certain medicines for seizures like carbamazepine, phenobarbital, phenytoin, primidone  certain medicines for stomach problems like dicyclomine, hyoscyamine  certain medicines for travel sickness like scopolamine  general anesthetics like halothane, isoflurane, methoxyflurane, propofol  ipratropium  local anesthetics like lidocaine, pramoxine, tetracaine  MAOIs like Carbex, Eldepryl, Marplan, Nardil, and Parnate  medicines that relax muscles for surgery  other medicines with acetaminophen  other narcotic medicines for pain or cough  phenothiazines like chlorpromazine, mesoridazine, prochlorperazine, thioridazine  rifampin This list may not describe all possible interactions. Give your health care provider a list of all the medicines, herbs, non-prescription drugs, or dietary supplements you use. Also tell them if you smoke, drink alcohol, or use illegal drugs. Some items may interact with your medicine. What should I watch for while using this medicine? Tell your health care provider if your pain does not go away, if it gets worse, or if you have new or a different type of pain. You may develop tolerance to this drug. Tolerance means that you will need a higher dose of the drug for pain relief. Tolerance is normal and is expected if you take this drug for a long time. There are different types of narcotic drugs (opioids) for pain. If you take more than one type at the same time, you may have more side effects. Give your health care provider a list of all drugs you use. He or she will tell you how much drug to take. Do not take more drug than directed. Get emergency help right away if you have  problems breathing. Do not suddenly stop taking your drug because you may develop a severe reaction. Your body becomes used to the drug. This does NOT mean you are addicted. Addiction is a behavior related to getting and using a drug for a nonmedical reason. If you have pain, you have a medical reason to take pain drug. Your health care provider will tell you how much drug to take. If your health care provider wants you to stop the drug, the dose will be slowly lowered over time to avoid any side effects. Talk to your health care provider about naloxone and how to get it. Naloxone is an emergency drug used for an opioid overdose. An overdose can happen if you take too much opioid. It can also happen if an opioid is taken with some other drugs or substances, like alcohol. Know the symptoms of an overdose, like trouble breathing, unusually tired or sleepy, or not being able to respond or wake up. Make sure to tell caregivers and close contacts where it is stored. Make sure they know how to use it. After naloxone is given, you must get emergency help right away. Naloxone is a temporary treatment. Repeat doses may be needed. Do not take other drugs that contain acetaminophen with this drug. Many non-prescription drugs contain acetaminophen. Always read labels carefully. If you have questions, ask your health care provider. If you take too much acetaminophen, get medical help right away. Too much acetaminophen can be very dangerous and cause liver damage. Even if you do not have symptoms, it is important to get help right away. You may get drowsy or dizzy. Do not drive, use machinery, or do anything that needs mental alertness until you know how this drug affects you. Do not stand up or sit up quickly, especially if you are an older patient. This reduces the risk of dizzy or fainting spells. Alcohol may interfere with the effect of this drug. Avoid alcoholic drinks. This drug will cause constipation. If you do not  have a bowel movement for 3 days, call your health care provider. Your mouth may get  dry. Chewing sugarless gum or sucking hard candy and drinking plenty of water may help. Contact your health care provider if the problem does not go away or is severe. What side effects may I notice from receiving this medicine? Side effects that you should report to your doctor or health care professional as soon as possible:  allergic reactions like skin rash, itching or hives, swelling of the face, lips, or tongue  breathing problems  confusion  redness, blistering, peeling or loosening of the skin, including inside the mouth  signs and symptoms of low blood pressure like dizziness; feeling faint or lightheaded, falls; unusually weak or tired  trouble passing urine or change in the amount of urine  yellowing of the eyes or skin Side effects that usually do not require medical attention (report to your doctor or health care professional if they continue or are bothersome):  constipation  dry mouth  nausea, vomiting  tiredness This list may not describe all possible side effects. Call your doctor for medical advice about side effects. You may report side effects to FDA at 1-800-FDA-1088. Where should I keep my medicine? Keep out of the reach of children. This medicine can be abused. Keep your medicine in a safe place to protect it from theft. Do not share this medicine with anyone. Selling or giving away this medicine is dangerous and against the law. Store at room temperature between 15 and 30 degrees C (59 and 86 degrees F). This medicine may cause harm and death if it is taken by other adults, children, or pets. Return medicine that has not been used to an official disposal site. Contact the DEA at 819-229-0218 or your city/county government to find a site. If you cannot return the medicine, flush it down the toilet. Do not use the medicine after the expiration date. NOTE: This sheet is a  summary. It may not cover all possible information. If you have questions about this medicine, talk to your doctor, pharmacist, or health care provider.  2021 Elsevier/Gold Standard (2018-10-03 12:25:54)    Ketorolac Oral Tablets What is this medicine? KETOROLAC (kee toe ROLE ak) is a non-steroidal anti-inflammatory drug, also known as an NSAID. It treats pain, inflammation, and swelling. This medicine may be used for other purposes; ask your health care provider or pharmacist if you have questions. COMMON BRAND NAME(S): Toradol What should I tell my health care provider before I take this medicine? They need to know if you have any of these conditions:  bleeding disorder  coronary artery bypass graft (CABG) within the past 2 weeks  heart attack  heart disease  heart failure  high blood pressure  if you often drink alcohol  kidney disease  liver disease  lung or breathing disease (asthma)  receiving steroids like dexamethasone or prednisone  smoke tobacco cigarettes  stomach bleeding  stomach ulcers, other stomach or intestine problems  take medicine to treat or prevent blood clots  an unusual or allergic reaction to ketorolac, other medicines, foods, dyes, or preservatives  pregnant or trying to get pregnant  breast-feeding How should I use this medicine? Take this medicine by mouth. Take it as directed on the prescription label at the same time every day. You can take it with or without food. If it upsets your stomach, take it with food. There may be unused or extra doses in the bottle after you finish the dosing cycle. Talk to your health care provider if you have questions about your dose. A special  MedGuide will be given to you by the pharmacist with each prescription and refill. Be sure to read this information carefully each time. Talk to your health care provider about the use of this medicine in children. Special care may be needed. Patients over 28 years  of age may have a stronger reaction and need a smaller dose. Overdosage: If you think you have taken too much of this medicine contact a poison control center or emergency room at once. NOTE: This medicine is only for you. Do not share this medicine with others. What if I miss a dose? If you miss a dose, skip it. Take your next dose at the normal time. Do not take extra or 2 doses at the same time to make up for the missed dose. What may interact with this medicine? Do not take this medicine with any of the following medications:  aspirin and aspirin-like medicines  cidofovir  methotrexate  NSAIDs, medicines for pain and inflammation, like ibuprofen or naproxen  pemetrexed  probenecid This medicine may also interact with the following medications:  alcohol  alendronate  alprazolam  carbamazepine  cyclosporine  diuretics  flavocoxid  fluoxetine  ginkgo  lithium  medicines for high blood pressure like enalapril  medicines that affect platelets like pentoxifylline  medicines that treat or prevent blood clots like heparin, warfarin  muscle relaxants  phenytoin  steroid medicines like prednisone or cortisone  thiothixene This list may not describe all possible interactions. Give your health care provider a list of all the medicines, herbs, non-prescription drugs, or dietary supplements you use. Also tell them if you smoke, drink alcohol, or use illegal drugs. Some items may interact with your medicine. What should I watch for while using this medicine? Visit your health care provider for regular checks on your progress. Tell your health care provider if your symptoms do not start to get better or if they get worse. Do not use this medicine for more than 5 days. It is only used for short-term treatment of moderate to severe pain. The risk of side effects such as kidney damage and stomach bleeding are higher if used for more than 5 days. Do not take other medicines  that contain aspirin, ibuprofen, or naproxen with this medicine. Side effects such as stomach upset, nausea, or ulcers may be more likely to occur. Many non-prescription medicines contain aspirin, ibuprofen, or naproxen. Always read labels carefully. This medicine can cause serious ulcers and bleeding in the stomach. It can happen with no warning. Smoking, drinking alcohol, older age, and poor health can also increase risks. Call your health care provider right away if you have stomach pain or blood in your vomit or stool. This medicine may cause serious skin reactions. They can happen weeks to months after starting the medicine. Contact your health care provider right away if you notice fevers or flu-like symptoms with a rash. The rash may be red or purple and then turn into blisters or peeling of the skin. Or, you might notice a red rash with swelling of the face, lips or lymph nodes in your neck or under your arms. This medicine does not prevent a heart attack or stroke. This medicine may increase the chance of a heart attack or stroke. The chance may increase the longer you use this medicine or if you have heart disease. If you take aspirin to prevent a heart attack or stroke, talk to your health care provider about using this medicine. You may get drowsy or  dizzy. Do not drive, use machinery, or do anything that needs mental alertness until you know how this medicine affects you. Do not stand up or sit up quickly, especially if you are an older patient. This reduces the risk of dizzy or fainting spells. What side effects may I notice from receiving this medicine? Side effects that you should report to your doctor or health care professional as soon as possible:  allergic reactions (skin rash, itching or hives; swelling of the face, lips, or tongue)  bleeding (bloody or black, tarry stools; red or dark brown urine; spitting up blood or brown material that looks like coffee grounds; red spots on the  skin; unusual bruising or bleeding from the eyes, gums, or nose)  heart attack (trouble breathing; pain or tightness in the chest, neck, back or arms; unusually weak or tired)  heart failure (trouble breathing; fast, irregular heartbeat; sudden weight gain; swelling of the ankles, feet, hands; unusually weak or tired)  kidney injury (trouble passing urine or change in the amount of urine)  liver injury (dark yellow or brown urine; general ill feeling or flu-like symptoms; loss of appetite, right upper belly pain; unusually weak or tired, yellowing of the eyes or skin)  low red blood cell counts (trouble breathing; feeling faint; lightheaded, falls; unusually weak or tired)  rash, fever, and swollen lymph nodes  redness, blistering, peeling, or loosening of the skin, including inside the mouth  stroke (changes in vision; confusion; trouble speaking or understanding; severe headaches; sudden numbness or weakness of the face, arm or leg; trouble walking; dizziness; loss of balance or coordination) Side effects that usually do not require medical attention (report to your doctor or health care professional if they continue or are bothersome):  constipation  decreased hearing, ringing in the ears  diarrhea  headache  nausea  passing gas  stomach pain  upset stomach This list may not describe all possible side effects. Call your doctor for medical advice about side effects. You may report side effects to FDA at 1-800-FDA-1088. Where should I keep my medicine? Keep out of the reach of children and pets. Store at room temperature between 15 and 30 degrees C (59 and 86 degrees F). Protect from moisture. Keep the container tightly closed. Protect from light. Get rid of any unused medicine after the expiration date. To get rid of medicines that are no longer needed or expired:  Take the medicine to a medicine take-back program. Check with your pharmacy or law enforcement to find a  location.  If you cannot return the medicine, check the label or package insert to see if the medicine should be thrown out in the garbage or flushed down the toilet. If you are not sure, ask your health care provider. If it is safe to put in the trash, pour the medicine out of the container. Mix the medicine with cat litter, dirt, coffee grounds, or other unwanted substance. Seal the mixture in a bag or container. Put it in the trash. NOTE: This sheet is a summary. It may not cover all possible information. If you have questions about this medicine, talk to your doctor, pharmacist, or health care provider.  2021 Elsevier/Gold Standard (2019-07-12 10:29:52)

## 2020-04-02 NOTE — Anesthesia Preprocedure Evaluation (Addendum)
Anesthesia Evaluation  Patient identified by MRN, date of birth, ID band Patient awake    Reviewed: Allergy & Precautions, H&P , NPO status , Patient's Chart, lab work & pertinent test results, reviewed documented beta blocker date and time   Airway Mallampati: II  TM Distance: >3 FB Neck ROM: full    Dental no notable dental hx. (+) Chipped,    Pulmonary neg pulmonary ROS,    Pulmonary exam normal breath sounds clear to auscultation       Cardiovascular Exercise Tolerance: Good hypertension, negative cardio ROS   Rhythm:regular Rate:Normal     Neuro/Psych negative neurological ROS  negative psych ROS   GI/Hepatic negative GI ROS, Neg liver ROS,   Endo/Other  negative endocrine ROS  Renal/GU negative Renal ROS  negative genitourinary   Musculoskeletal   Abdominal   Peds  Hematology negative hematology ROS (+)   Anesthesia Other Findings   Reproductive/Obstetrics negative OB ROS                            Anesthesia Physical Anesthesia Plan  ASA: II  Anesthesia Plan: General   Post-op Pain Management:    Induction:   PONV Risk Score and Plan: Ondansetron  Airway Management Planned:   Additional Equipment:   Intra-op Plan:   Post-operative Plan:   Informed Consent: I have reviewed the patients History and Physical, chart, labs and discussed the procedure including the risks, benefits and alternatives for the proposed anesthesia with the patient or authorized representative who has indicated his/her understanding and acceptance.     Dental Advisory Given  Plan Discussed with: CRNA  Anesthesia Plan Comments:         Anesthesia Quick Evaluation

## 2020-04-03 LAB — SURGICAL PATHOLOGY

## 2020-04-06 ENCOUNTER — Encounter (HOSPITAL_COMMUNITY): Payer: Self-pay | Admitting: Obstetrics & Gynecology

## 2020-04-13 ENCOUNTER — Encounter: Payer: Self-pay | Admitting: Obstetrics & Gynecology

## 2020-04-13 ENCOUNTER — Ambulatory Visit (INDEPENDENT_AMBULATORY_CARE_PROVIDER_SITE_OTHER): Payer: Medicare HMO | Admitting: Obstetrics & Gynecology

## 2020-04-13 ENCOUNTER — Other Ambulatory Visit: Payer: Self-pay

## 2020-04-13 VITALS — BP 167/91 | HR 90 | Wt 213.0 lb

## 2020-04-13 DIAGNOSIS — Z9889 Other specified postprocedural states: Secondary | ICD-10-CM

## 2020-04-13 NOTE — Progress Notes (Signed)
  HPI: Patient returns for routine postoperative follow-up having undergone hysteroscopy uterine curettage on . 04/02/20 The patient's immediate postoperative recovery has been unremarkable. Since hospital discharge the patient reports no problems.   Current Outpatient Medications: HYDROcodone-acetaminophen (NORCO/VICODIN) 5-325 MG tablet, Take 1 tablet by mouth every 6 (six) hours as needed., Disp: 15 tablet, Rfl: 0 ketorolac (TORADOL) 10 MG tablet, Take 1 tablet (10 mg total) by mouth every 8 (eight) hours as needed., Disp: 15 tablet, Rfl: 0 lisinopril (ZESTRIL) 10 MG tablet, Take 10 mg by mouth daily., Disp: , Rfl:   Menthol-Methyl Salicylate (MUSCLE RUB) 10-15 % CREA, Apply 1 application topically as needed for muscle pain., Disp: , Rfl:  Multiple Vitamins-Minerals (MULTIVITAMIN WITH MINERALS) tablet, Take 1 tablet by mouth daily., Disp: , Rfl:  naproxen sodium (ALEVE) 220 MG tablet, Take 220 mg by mouth daily as needed (pain)., Disp: , Rfl:  ondansetron (ZOFRAN ODT) 8 MG disintegrating tablet, Take 1 tablet (8 mg total) by mouth every 8 (eight) hours as needed for nausea or vomiting., Disp: 8 tablet, Rfl: 0 OVER THE COUNTER MEDICATION, Take 0.5 tablets by mouth daily as needed (pain). CBD gummies, Disp: , Rfl:  Probiotic Product (PROBIOTIC PO), Take 1 capsule by mouth daily., Disp: , Rfl:  TURMERIC PO, Take 1 tablet by mouth daily., Disp: , Rfl:   No current facility-administered medications for this visit.    Blood pressure (!) 167/91, pulse 90, weight 213 lb (96.6 kg).  Physical Exam: WDWN NAD  Diagnostic Tests:   Pathology: Benign endomtrial polyp  Impression:   ICD-10-CM   1. S/P hysteroscopy with removal of benign endometrial polyp  Z98.890       Plan: No follow up needed    Follow up: No follow-ups on file.   Florian Buff, MD

## 2020-07-23 DIAGNOSIS — H02831 Dermatochalasis of right upper eyelid: Secondary | ICD-10-CM | POA: Diagnosis not present

## 2020-07-23 DIAGNOSIS — H02834 Dermatochalasis of left upper eyelid: Secondary | ICD-10-CM | POA: Diagnosis not present

## 2020-07-23 DIAGNOSIS — H57813 Brow ptosis, bilateral: Secondary | ICD-10-CM | POA: Diagnosis not present

## 2020-07-23 DIAGNOSIS — H019 Unspecified inflammation of eyelid: Secondary | ICD-10-CM | POA: Diagnosis not present

## 2020-07-23 DIAGNOSIS — D485 Neoplasm of uncertain behavior of skin: Secondary | ICD-10-CM | POA: Diagnosis not present

## 2020-07-23 DIAGNOSIS — D487 Neoplasm of uncertain behavior of other specified sites: Secondary | ICD-10-CM | POA: Diagnosis not present

## 2020-07-31 DIAGNOSIS — E782 Mixed hyperlipidemia: Secondary | ICD-10-CM | POA: Diagnosis not present

## 2020-07-31 DIAGNOSIS — I10 Essential (primary) hypertension: Secondary | ICD-10-CM | POA: Diagnosis not present

## 2020-07-31 DIAGNOSIS — R7301 Impaired fasting glucose: Secondary | ICD-10-CM | POA: Diagnosis not present

## 2020-08-07 ENCOUNTER — Other Ambulatory Visit (HOSPITAL_COMMUNITY): Payer: Self-pay | Admitting: Family Medicine

## 2020-08-07 DIAGNOSIS — I10 Essential (primary) hypertension: Secondary | ICD-10-CM | POA: Diagnosis not present

## 2020-08-07 DIAGNOSIS — Z1211 Encounter for screening for malignant neoplasm of colon: Secondary | ICD-10-CM | POA: Diagnosis not present

## 2020-08-07 DIAGNOSIS — Z1231 Encounter for screening mammogram for malignant neoplasm of breast: Secondary | ICD-10-CM

## 2020-08-07 DIAGNOSIS — L989 Disorder of the skin and subcutaneous tissue, unspecified: Secondary | ICD-10-CM | POA: Diagnosis not present

## 2020-08-07 DIAGNOSIS — E785 Hyperlipidemia, unspecified: Secondary | ICD-10-CM | POA: Diagnosis not present

## 2020-08-07 DIAGNOSIS — E119 Type 2 diabetes mellitus without complications: Secondary | ICD-10-CM | POA: Diagnosis not present

## 2020-08-07 DIAGNOSIS — M25561 Pain in right knee: Secondary | ICD-10-CM | POA: Diagnosis not present

## 2020-08-07 DIAGNOSIS — M25562 Pain in left knee: Secondary | ICD-10-CM | POA: Diagnosis not present

## 2020-08-31 DIAGNOSIS — D1801 Hemangioma of skin and subcutaneous tissue: Secondary | ICD-10-CM | POA: Diagnosis not present

## 2020-08-31 DIAGNOSIS — Z809 Family history of malignant neoplasm, unspecified: Secondary | ICD-10-CM | POA: Diagnosis not present

## 2020-08-31 DIAGNOSIS — L814 Other melanin hyperpigmentation: Secondary | ICD-10-CM | POA: Diagnosis not present

## 2020-09-10 DIAGNOSIS — D485 Neoplasm of uncertain behavior of skin: Secondary | ICD-10-CM | POA: Diagnosis not present

## 2020-09-10 DIAGNOSIS — D2310 Other benign neoplasm of skin of unspecified eyelid, including canthus: Secondary | ICD-10-CM | POA: Diagnosis not present

## 2020-11-19 DIAGNOSIS — R7301 Impaired fasting glucose: Secondary | ICD-10-CM | POA: Diagnosis not present

## 2020-11-19 DIAGNOSIS — Z0001 Encounter for general adult medical examination with abnormal findings: Secondary | ICD-10-CM | POA: Diagnosis not present

## 2020-11-19 DIAGNOSIS — I10 Essential (primary) hypertension: Secondary | ICD-10-CM | POA: Diagnosis not present

## 2020-11-24 DIAGNOSIS — I10 Essential (primary) hypertension: Secondary | ICD-10-CM | POA: Diagnosis not present

## 2020-11-24 DIAGNOSIS — E119 Type 2 diabetes mellitus without complications: Secondary | ICD-10-CM | POA: Diagnosis not present

## 2020-11-24 DIAGNOSIS — E785 Hyperlipidemia, unspecified: Secondary | ICD-10-CM | POA: Diagnosis not present

## 2020-11-24 DIAGNOSIS — Z0001 Encounter for general adult medical examination with abnormal findings: Secondary | ICD-10-CM | POA: Diagnosis not present

## 2020-12-16 DIAGNOSIS — J302 Other seasonal allergic rhinitis: Secondary | ICD-10-CM | POA: Diagnosis not present

## 2020-12-16 DIAGNOSIS — H0261 Xanthelasma of right upper eyelid: Secondary | ICD-10-CM | POA: Diagnosis not present

## 2020-12-16 DIAGNOSIS — H0264 Xanthelasma of left upper eyelid: Secondary | ICD-10-CM | POA: Diagnosis not present

## 2021-03-18 DIAGNOSIS — J029 Acute pharyngitis, unspecified: Secondary | ICD-10-CM | POA: Diagnosis not present

## 2021-03-18 DIAGNOSIS — R519 Headache, unspecified: Secondary | ICD-10-CM | POA: Diagnosis not present

## 2021-04-06 DIAGNOSIS — R7301 Impaired fasting glucose: Secondary | ICD-10-CM | POA: Diagnosis not present

## 2021-04-06 DIAGNOSIS — I10 Essential (primary) hypertension: Secondary | ICD-10-CM | POA: Diagnosis not present

## 2021-04-09 DIAGNOSIS — E785 Hyperlipidemia, unspecified: Secondary | ICD-10-CM | POA: Diagnosis not present

## 2021-04-09 DIAGNOSIS — I10 Essential (primary) hypertension: Secondary | ICD-10-CM | POA: Diagnosis not present

## 2021-04-09 DIAGNOSIS — E119 Type 2 diabetes mellitus without complications: Secondary | ICD-10-CM | POA: Diagnosis not present

## 2021-05-26 DIAGNOSIS — M1712 Unilateral primary osteoarthritis, left knee: Secondary | ICD-10-CM | POA: Diagnosis not present

## 2021-05-26 DIAGNOSIS — M1711 Unilateral primary osteoarthritis, right knee: Secondary | ICD-10-CM | POA: Diagnosis not present

## 2021-10-13 DIAGNOSIS — E119 Type 2 diabetes mellitus without complications: Secondary | ICD-10-CM | POA: Diagnosis not present

## 2021-10-19 ENCOUNTER — Other Ambulatory Visit (HOSPITAL_COMMUNITY): Payer: Self-pay | Admitting: Family Medicine

## 2021-10-19 DIAGNOSIS — Z1239 Encounter for other screening for malignant neoplasm of breast: Secondary | ICD-10-CM | POA: Diagnosis not present

## 2021-10-19 DIAGNOSIS — Z1231 Encounter for screening mammogram for malignant neoplasm of breast: Secondary | ICD-10-CM

## 2021-10-19 DIAGNOSIS — E119 Type 2 diabetes mellitus without complications: Secondary | ICD-10-CM | POA: Diagnosis not present

## 2021-10-19 DIAGNOSIS — Z0001 Encounter for general adult medical examination with abnormal findings: Secondary | ICD-10-CM | POA: Diagnosis not present

## 2021-10-19 DIAGNOSIS — L298 Other pruritus: Secondary | ICD-10-CM | POA: Diagnosis not present

## 2021-10-19 DIAGNOSIS — E785 Hyperlipidemia, unspecified: Secondary | ICD-10-CM | POA: Diagnosis not present

## 2021-10-19 DIAGNOSIS — I10 Essential (primary) hypertension: Secondary | ICD-10-CM | POA: Diagnosis not present

## 2022-03-03 DIAGNOSIS — M25562 Pain in left knee: Secondary | ICD-10-CM | POA: Diagnosis not present

## 2022-03-03 DIAGNOSIS — M25561 Pain in right knee: Secondary | ICD-10-CM | POA: Diagnosis not present

## 2022-03-03 DIAGNOSIS — M542 Cervicalgia: Secondary | ICD-10-CM | POA: Diagnosis not present

## 2022-04-28 DIAGNOSIS — I10 Essential (primary) hypertension: Secondary | ICD-10-CM | POA: Diagnosis not present

## 2022-04-28 DIAGNOSIS — E119 Type 2 diabetes mellitus without complications: Secondary | ICD-10-CM | POA: Diagnosis not present

## 2022-05-04 DIAGNOSIS — I10 Essential (primary) hypertension: Secondary | ICD-10-CM | POA: Diagnosis not present

## 2022-05-04 DIAGNOSIS — E785 Hyperlipidemia, unspecified: Secondary | ICD-10-CM | POA: Diagnosis not present

## 2022-05-04 DIAGNOSIS — Z5320 Procedure and treatment not carried out because of patient's decision for unspecified reasons: Secondary | ICD-10-CM | POA: Diagnosis not present

## 2022-05-04 DIAGNOSIS — Z0000 Encounter for general adult medical examination without abnormal findings: Secondary | ICD-10-CM | POA: Diagnosis not present

## 2022-05-04 DIAGNOSIS — E119 Type 2 diabetes mellitus without complications: Secondary | ICD-10-CM | POA: Diagnosis not present

## 2022-05-04 DIAGNOSIS — L298 Other pruritus: Secondary | ICD-10-CM | POA: Diagnosis not present

## 2022-11-09 DIAGNOSIS — I10 Essential (primary) hypertension: Secondary | ICD-10-CM | POA: Diagnosis not present

## 2022-11-09 DIAGNOSIS — E119 Type 2 diabetes mellitus without complications: Secondary | ICD-10-CM | POA: Diagnosis not present

## 2022-11-15 DIAGNOSIS — E785 Hyperlipidemia, unspecified: Secondary | ICD-10-CM | POA: Diagnosis not present

## 2022-11-15 DIAGNOSIS — Z5320 Procedure and treatment not carried out because of patient's decision for unspecified reasons: Secondary | ICD-10-CM | POA: Diagnosis not present

## 2022-11-15 DIAGNOSIS — E119 Type 2 diabetes mellitus without complications: Secondary | ICD-10-CM | POA: Diagnosis not present

## 2022-11-15 DIAGNOSIS — I10 Essential (primary) hypertension: Secondary | ICD-10-CM | POA: Diagnosis not present

## 2022-11-15 DIAGNOSIS — L298 Other pruritus: Secondary | ICD-10-CM | POA: Diagnosis not present

## 2022-11-17 ENCOUNTER — Encounter (HOSPITAL_COMMUNITY): Payer: Self-pay

## 2022-11-17 ENCOUNTER — Emergency Department (HOSPITAL_COMMUNITY)
Admission: EM | Admit: 2022-11-17 | Discharge: 2022-11-17 | Disposition: A | Discharge status: Home / Self Care | Payer: Medicare HMO | Attending: Emergency Medicine | Admitting: Emergency Medicine

## 2022-11-17 ENCOUNTER — Other Ambulatory Visit: Payer: Self-pay

## 2022-11-17 ENCOUNTER — Emergency Department (HOSPITAL_COMMUNITY): Payer: Medicare HMO

## 2022-11-17 DIAGNOSIS — S61211A Laceration without foreign body of left index finger without damage to nail, initial encounter: Secondary | ICD-10-CM | POA: Insufficient documentation

## 2022-11-17 DIAGNOSIS — Z23 Encounter for immunization: Secondary | ICD-10-CM | POA: Diagnosis not present

## 2022-11-17 DIAGNOSIS — Y92096 Garden or yard of other non-institutional residence as the place of occurrence of the external cause: Secondary | ICD-10-CM | POA: Diagnosis not present

## 2022-11-17 DIAGNOSIS — Z79899 Other long term (current) drug therapy: Secondary | ICD-10-CM | POA: Insufficient documentation

## 2022-11-17 DIAGNOSIS — W293XXA Contact with powered garden and outdoor hand tools and machinery, initial encounter: Secondary | ICD-10-CM | POA: Insufficient documentation

## 2022-11-17 DIAGNOSIS — S60941A Unspecified superficial injury of left index finger, initial encounter: Secondary | ICD-10-CM | POA: Diagnosis not present

## 2022-11-17 MED ORDER — CEPHALEXIN 500 MG PO CAPS: 500.0000 mg | ORAL_CAPSULE | Freq: Four times a day (QID) | ORAL | 0 refills | Status: AC

## 2022-11-17 MED ORDER — LIDOCAINE HCL (PF) 1 % IJ SOLN
10.0000 mL | Freq: Once | INTRAMUSCULAR | Status: AC
  Administered 2022-11-17: 10 mL
  Filled 2022-11-17: qty 10

## 2022-11-17 NOTE — ED Provider Notes (Signed)
[place] EMERGENCY DEPARTMENT AT [facility] Provider Note   CSN: [id number] Arrival date & time: 11/17/22  1107     History  Chief Complaint  Patient presents with   Laceration    CONNI BROUILLARD is a 72 y.o. female.   Laceration   72 year old lady presents emergency department with complaints of laceration.  Patient states that she was using an Publishing rights manager earlier and ended up cutting her pointer finger on her left hand.  Was seen by primary care who updated tetanus but told her to come to the emergency department due to concern for possible bony involvement.  Denies trauma elsewhere.  Denies weakness or loss range of motion.  Past medical history significant for hyperlipidemia, hypertension Home Medications Prior to Admission medications   Medication Sig Start Date End Date Taking? Authorizing Provider  cephALEXin (KEFLEX) 500 MG capsule Take 1 capsule (500 mg total) by mouth 4 (four) times daily. 11/17/22  Yes Sherian Maroon A, PA  HYDROcodone-acetaminophen (NORCO/VICODIN) 5-325 MG tablet Take 1 tablet by mouth every 6 (six) hours as needed. 04/02/20   Lazaro Arms, MD  ketorolac (TORADOL) 10 MG tablet Take 1 tablet (10 mg total) by mouth every 8 (eight) hours as needed. 04/02/20   Lazaro Arms, MD  lisinopril (ZESTRIL) 10 MG tablet Take 10 mg by mouth daily. 04/01/20   [provider]  Menthol-Methyl Salicylate (MUSCLE RUB) 10-15 % CREA Apply 1 application topically as needed for muscle pain.    [provider]  Multiple Vitamins-Minerals (MULTIVITAMIN WITH MINERALS) tablet Take 1 tablet by mouth daily.    [provider]  naproxen sodium (ALEVE) 220 MG tablet Take 220 mg by mouth daily as needed (pain).    [provider]  ondansetron (ZOFRAN ODT) 8 MG disintegrating tablet Take 1 tablet (8 mg total) by mouth every 8 (eight) hours as needed for nausea or vomiting. 04/02/20   Lazaro Arms, MD  OVER THE COUNTER  MEDICATION Take 0.5 tablets by mouth daily as needed (pain). CBD gummies    [provider]  Probiotic Product (PROBIOTIC PO) Take 1 capsule by mouth daily.    [provider]  TURMERIC PO Take 1 tablet by mouth daily.    [provider]      Allergies    Patient has no known allergies.    Review of Systems   Review of Systems  All other systems reviewed and are negative.   Physical Exam Updated Vital Signs BP (!) 159/87 (BP Location: Right Arm)   Pulse 78   Temp 97.9 F (36.6 C) (Oral)   Resp 20   Ht 5\' 4"  (1.626 m)   Wt 88.5 kg   SpO2 100%   BMI 33.47 kg/m  Physical Exam Vitals and nursing note reviewed.  Constitutional:      General: She is not in acute distress.    Appearance: She is well-developed.  HENT:     Head: Normocephalic and atraumatic.  Eyes:     Conjunctiva/sclera: Conjunctivae normal.  Cardiovascular:     Rate and Rhythm: Normal rate and regular rhythm.  Pulmonary:     Effort: Pulmonary effort is normal. No respiratory distress.     Breath sounds: Normal breath sounds.  Abdominal:     Palpations: Abdomen is soft.     Tenderness: There is no abdominal tenderness.  Musculoskeletal:        General: No swelling.     Cervical back: Neck  supple.     Comments: Patient with 3-3.5 similar laceration noted on the palmar aspect of left second digit.  No active bleeding or foreign body appreciated.  Patient with full range of motion of digit.  Mild sensory deficits distally.  Skin:    General: Skin is warm and dry.     Capillary Refill: Capillary refill takes less than 2 seconds.  Neurological:     Mental Status: She is alert.  Psychiatric:        Mood and Affect: Mood normal.     ED Results / Procedures / Treatments   Labs (all labs ordered are listed, but only abnormal results are displayed) Labs Reviewed - No data to display  EKG None  Radiology DG Hand Complete Left  Result Date: 11/17/2022 CLINICAL DATA:   Laceration to left second digit with electric trimmer EXAM: LEFT HAND - COMPLETE 3+ VIEW COMPARISON:  None Available. FINDINGS: No fracture or dislocation of the left hand. Soft tissue laceration of the tip of the left second digit. No radiopaque foreign body. Moderate osteoarthritic pattern arthrosis. IMPRESSION: 1. No fracture or dislocation of the left hand. 2. Soft tissue laceration of the tip of the left second digit. No radiopaque foreign body. Electronically Signed   By: Jearld Lesch M.D.   On: 11/17/2022 13:22    Procedures .Marland KitchenLaceration Repair  Date/Time: 11/17/2022 2:19 PM  Performed by: Peter Garter, PA Authorized by: Peter Garter, PA   Consent:    Consent obtained:  Verbal   Consent given by:  Patient   Risks, benefits, and alternatives were discussed: yes     Risks discussed:  Infection, need for additional repair, nerve damage, poor wound healing, pain, poor cosmetic result, vascular damage, tendon damage and retained foreign body   Alternatives discussed:  No treatment and delayed treatment Universal protocol:    Procedure explained and questions answered to patient or proxy's satisfaction: yes     Patient identity confirmed:  Verbally with patient Anesthesia:    Anesthesia method:  Nerve block   Block needle gauge:  25 G   Block anesthetic:  Lidocaine 1% w/o epi   Block technique:  Ring   Block injection procedure:  Incremental injection, introduced needle, negative aspiration for blood, anatomic landmarks palpated and anatomic landmarks identified   Block outcome:  Anesthesia achieved Laceration details:    Location:  Finger   Finger location:  L index finger   Length (cm):  3.5 Pre-procedure details:    Preparation:  Patient was prepped and draped in usual sterile fashion and imaging obtained to evaluate for foreign bodies Exploration:    Limited defect created (wound extended): no     Hemostasis achieved with:  Direct pressure   Imaging obtained: x-ray      Imaging outcome: foreign body not noted     Wound exploration: wound explored through full range of motion and entire depth of wound visualized     Contaminated: no   Treatment:    Area cleansed with:  Povidone-iodine   Amount of cleaning:  Standard   Irrigation volume:  500cc   Irrigation method:  Syringe   Visualized foreign bodies/material removed: no     Debridement:  None   Undermining:  None   Scar revision: no   Skin repair:    Repair method:  Sutures   Suture size:  4-0   Suture material:  Prolene   Suture technique:  Simple interrupted   Number of sutures:  6  Approximation:    Approximation:  Close Repair type:    Repair type:  Simple Post-procedure details:    Dressing:  Splint for protection and bulky dressing   Procedure completion:  Tolerated well, no immediate complications     Medications Ordered in ED Medications  lidocaine (PF) (XYLOCAINE) 1 % injection 10 mL (10 mLs Other Given 11/17/22 1304)    ED Course/ Medical Decision Making/ A&P                                 Medical Decision Making Amount and/or Complexity of Data Reviewed Radiology: ordered.  Risk Prescription drug management.   This patient presents to the ED for concern of laceration, this involves an extensive number of treatment options, and is a complaint that carries with it a high risk of complications and morbidity.  The differential diagnosis includes fracture, dislocation, foreign body retainment, ligamentous/tendinous injury, neurovascular compromise   Co morbidities that complicate the patient evaluation  See HPI   Additional history obtained:  Additional history obtained from EMR External records from outside source obtained and reviewed including hospital records   Lab Tests:  N/a   Imaging Studies ordered:  I ordered imaging studies including x-ray left hand I independently visualized and interpreted imaging which showed no acute fracture/dislocation.   Soft tissue injury. I agree with the radiologist interpretation   Cardiac Monitoring: / EKG:  The patient was maintained on a cardiac monitor.  I personally viewed and interpreted the cardiac monitored which showed an underlying rhythm of: Sinus   Consultations Obtained:  N/a   Problem List / ED Course / Critical interventions / Medication management  Left finger laceration I ordered medication including lidocaine   Reevaluation of the patient after these medicines showed that the patient improved I have reviewed the patients home medicines and have made adjustments as needed   Social Determinants of Health:  Denies tobacco, licit drug use   Test / Admission - Considered:  Left finger laceration Vitals signs significant for hypertension blood pressure 159/87. Otherwise within normal range and stable throughout visit. Imaging studies significant for: See above 72 year old female presents emergency room with complaints of finger laceration.  Patient found with evidence of finger laceration to the palmar aspect of left second digit of hand.  Extremity obtained which showed no evidence of bony involvement.  Patient without any evidence of loss of range of motion or obvious ligamentous/tendinous involvement.  Wound repaired in manner as pictured above.  Given contaminated nature of wound, will place patient on prophylactic antibiotics and recommend proper wound care at home as described in AVS.  Will recommend follow-up for suture removal as directed in AVS.  Treatment plan discussed with patient and she acknowledged understanding was agreeable to said plan.  Patient overall well-appearing, afebrile in no acute distress. Worrisome signs and symptoms were discussed with the patient, and the patient acknowledged understanding to return to the ED if noticed. Patient was stable upon discharge.          Final Clinical Impression(s) / ED Diagnoses Final diagnoses:  Laceration of left  index finger without foreign body without damage to nail, initial encounter    Rx / DC Orders ED Discharge Orders          Ordered    cephALEXin (KEFLEX) 500 MG capsule  4 times daily        11/17/22 1447  Peter Garter, [place] 11/17/22 1452    Terrilee Files, MD 11/17/22 [phone]

## 2022-11-17 NOTE — Discharge Instructions (Signed)
As discussed, wound was repaired using 6 nonabsorbable sutures.  See information attached your discharge papers regarding wound care at home.  Recommend washing area gently with warm soapy water and avoid submersion in bodies of water.  You may take Tylenol/Motrin for pain.  Will place on antibiotics given dirty nature of wound.  Wear splint as needed for protection.  Follow-up for suture removal in 10-14 days.  Please do not hesitate to return to emergency department if there are worrisome signs and symptoms we discussed become apparent.

## 2022-11-17 NOTE — ED Triage Notes (Signed)
Pt cut hand with Publishing rights manager Went to PCP PCP gave tdap and told to go to ED  LEFT hand 2nd digit

## 2022-12-01 DIAGNOSIS — H6122 Impacted cerumen, left ear: Secondary | ICD-10-CM | POA: Diagnosis not present

## 2022-12-01 DIAGNOSIS — S61211A Laceration without foreign body of left index finger without damage to nail, initial encounter: Secondary | ICD-10-CM | POA: Diagnosis not present

## 2022-12-01 DIAGNOSIS — S61211D Laceration without foreign body of left index finger without damage to nail, subsequent encounter: Secondary | ICD-10-CM | POA: Diagnosis not present

## 2022-12-01 DIAGNOSIS — X58XXXD Exposure to other specified factors, subsequent encounter: Secondary | ICD-10-CM | POA: Diagnosis not present

## 2023-07-12 DIAGNOSIS — E119 Type 2 diabetes mellitus without complications: Secondary | ICD-10-CM | POA: Diagnosis not present

## 2023-07-12 DIAGNOSIS — I10 Essential (primary) hypertension: Secondary | ICD-10-CM | POA: Diagnosis not present

## 2023-07-18 DIAGNOSIS — I10 Essential (primary) hypertension: Secondary | ICD-10-CM | POA: Diagnosis not present

## 2023-07-18 DIAGNOSIS — E119 Type 2 diabetes mellitus without complications: Secondary | ICD-10-CM | POA: Diagnosis not present

## 2023-07-18 DIAGNOSIS — Z5320 Procedure and treatment not carried out because of patient's decision for unspecified reasons: Secondary | ICD-10-CM | POA: Diagnosis not present

## 2023-07-18 DIAGNOSIS — E785 Hyperlipidemia, unspecified: Secondary | ICD-10-CM | POA: Diagnosis not present

## 2023-11-24 DIAGNOSIS — J069 Acute upper respiratory infection, unspecified: Secondary | ICD-10-CM | POA: Diagnosis not present
